# Patient Record
Sex: Female | Born: 1970 | Race: White | Hispanic: No | Marital: Single | Smoking: Former smoker
Health system: Southern US, Academic
[De-identification: ages and names within clinical notes are randomized; demographics above are authoritative.]

## PROBLEM LIST (undated history)

## (undated) DIAGNOSIS — F329 Major depressive disorder, single episode, unspecified: Secondary | ICD-10-CM

## (undated) DIAGNOSIS — F32A Depression, unspecified: Secondary | ICD-10-CM

## (undated) DIAGNOSIS — E0590 Thyrotoxicosis, unspecified without thyrotoxic crisis or storm: Secondary | ICD-10-CM

## (undated) DIAGNOSIS — I10 Essential (primary) hypertension: Secondary | ICD-10-CM

## (undated) HISTORY — PX: HX WISDOM TEETH EXTRACTION: SHX21

## (undated) HISTORY — DX: Depression, unspecified: F32.A

## (undated) HISTORY — DX: Thyrotoxicosis, unspecified without thyrotoxic crisis or storm: E05.90

## (undated) HISTORY — PX: HX TUBAL LIGATION: SHX77

## (undated) HISTORY — PX: HX FOOT SURGERY: 2100001154

## (undated) HISTORY — DX: Essential (primary) hypertension: I10

---
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
## 2018-06-19 ENCOUNTER — Ambulatory Visit (INDEPENDENT_AMBULATORY_CARE_PROVIDER_SITE_OTHER): Payer: Managed Care, Other (non HMO)

## 2018-06-19 ENCOUNTER — Other Ambulatory Visit: Payer: Self-pay

## 2018-06-19 ENCOUNTER — Encounter (INDEPENDENT_AMBULATORY_CARE_PROVIDER_SITE_OTHER): Payer: Self-pay

## 2018-06-19 VITALS — BP 132/82 | HR 115 | Resp 18 | Ht 70.0 in | Wt 139.6 lb

## 2018-06-19 DIAGNOSIS — Z87891 Personal history of nicotine dependence: Secondary | ICD-10-CM

## 2018-06-19 DIAGNOSIS — E0590 Thyrotoxicosis, unspecified without thyrotoxic crisis or storm: Secondary | ICD-10-CM

## 2018-06-19 DIAGNOSIS — E559 Vitamin D deficiency, unspecified: Secondary | ICD-10-CM

## 2018-06-19 DIAGNOSIS — Z6820 Body mass index (BMI) 20.0-20.9, adult: Secondary | ICD-10-CM

## 2018-06-19 DIAGNOSIS — E538 Deficiency of other specified B group vitamins: Secondary | ICD-10-CM

## 2018-06-19 DIAGNOSIS — M6281 Muscle weakness (generalized): Secondary | ICD-10-CM

## 2018-06-19 LAB — CREATINE KINASE (CK), TOTAL, SERUM: CREATINE KINASE (CK): 122 U/L (ref 26–192)

## 2018-06-19 MED ORDER — METHIMAZOLE 10 MG TABLET
10.0000 mg | ORAL_TABLET | Freq: Every day | ORAL | 0 refills | Status: DC
Start: 2018-06-19 — End: 2018-06-26

## 2018-06-19 NOTE — Progress Notes (Addendum)
Endocrinology, Medical Staff Office Bldg  [address] [place] [address]  [phone]    Date: 06/19/2018  Patient Name:  Deborah Parrish   Age: 48 y.o.  Attending: [name], MD  PCP: No Pcp    Assessment/Recommendations:   1. Thyrotoxicosis.  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    Check thyroid stimulating immunoglobulin level today; if positive can treat presumptively for Graves' and if negative will need thyroid scan uptake.  Start methimazole 15 mg a day, she was counseled and understands to hold medication and call of jaundice, fever, chills or sore throat.  Start atenolol 12.5 mg a day and call me with vital signs in the next few days.  I think her edema and air hunger are both due to thyrotoxicosis but further evaluations will need to be considered if not improving; patient understands to call if any worsening.  She was counseled to avoid caffeine, NSAIDs and call/cold medications.  I also asked her to back off on alcohol consumption.  She is planning on traveling to the [place] in coming weeks.  I asked her to reconsider that, especially if not feeling better.  She was encouraged to check Internet regarding availability of healthcare facilities at her destination, [place].    2. Muscle weakness.  Plan:  Check CPK.  Suspect due to hyperthyroidism but I asked her to call back if not improving to make sure not other factors at play.  She does also have low vitamin-D and  more than optimal alcohol intake.      3. Low vitamin-D.  Plan:  Presumptive autoimmune thyroid disease and some gastrointestinal symptoms in addition to low vitamin-D.  So, I will check celiac serology, which is commonly positive in people with thyroid disease.    4. Low B12.  Plan:  Again, concern for autoimmune thyroid disease.  Check intrinsic factor antibodies.      No follow-ups on file.      Chief Complaint:    Chief Complaint   Patient presents with   . New Patient     hyperthyroidism       . Other     pt complains of HR always increased x 5 months       History of Present Illness:    This 48 year old female was referred by Prescott Parma for evaluation of thyrotoxicosis.  Beginning in  October, at about the same time she quit smoking, patient began having issues with increasing heat intolerance, heart racing, tremors, moodiness and insomnia.  She was found to have evidence of thyrotoxicosis on labs as below.  There is a family history of what sounds like Graves' disease in a sister.      Patient denies taking any thyroid related medicines or supplements; no biotin; no contrast, kelp or other iodine exposure; no neck pain.  She is not having any issues with high grittiness or discomfort.  Her weight is up a few lb since she stop smoking.      She is having trouble with leg weakness with walking and has difficulty standing in the mornings.  Does drink up to 6 or 8 alcohol servings nightly.  Took atenolol 25 mg a day but pulse fell into the 50s and 60s, decreased to 12.5 mg a day and then stop.      She has been having some extremity edema and mild air hunger but has no history of cardiovascular disease or heart failure  Does not recall  any issues with low pulse, shortness of breath or other issues on atenolol; she was feeling poorly when she was on atenolol but still feels fatigued.  Bowels are loose at times.    Lab review of results from 06/03/2018 shows undetectable TSH, free T4 of 2.75 (0.82-1.77), free T3 of 11.4 (2.0-4.4).  CMP was unremarkable with normal transaminases and alkaline phosphatase level.  CBC with a white count of 5.0.  ESR was only 3.  Additionally, B12 was 183 and vitamin-D 19.    Allergies:  No Known Allergies    Medications:    No outpatient medications have been marked as taking for the 06/19/18 encounter (Office Visit) with [name], MD.       Past Medical History:  Past Medical History:   Diagnosis Date   . Depression    . Hypertension    . Hyperthyroidism             Social History:    Social History     Socioeconomic History   . Marital status: Single     Spouse name: Not on file   . Number of children: Not on file   . Years of education: Not on file   . Highest education level: Not on file   Tobacco Use   . Smoking status: Former Smoker     Packs/day: 1.00     Years: 20.00     Pack years: 20.00     Types: Cigarettes     Last attempt to quit: 01/14/2018     Years since quitting: 0.4   . Smokeless tobacco: Never Used   Substance and Sexual Activity   . Alcohol use: Yes     Frequency: 4 or more times a week     Drinks per session: 3 or 4   . Drug use: Not Currently   . Sexual activity: Yes     Partners: Male     Birth control/protection: Other       Family History:  Family Medical History:     Problem Relation (Age of Onset)    Alzheimer's/Dementia Maternal Grandmother    Arthritis-osteo Maternal Grandmother    Arthritis-rheumatoid Maternal Grandmother    Asthma Sister    Cancer Sister, Maternal Grandmother    Heart Attack Mother    Stroke Father    Thyroid Disease Sister              Review of Systems:  Constitutional: negative for fevers, chills, weight changes  HEENT: negative for acute vision changes or URI symptoms  Neck:  negative dysphonia, dysphagia or neck pain  Respiratory: negative for cough, sputum, dyspnea on exertion  Cardiovascular: negative for chest discomfort, palpitations  Gastrointestinal: negative for nausea, pain, stool changes  Genitourinary: negative for frequency, dysuria, nocturia, hesitancy  Hematologic/lymphatic: negative for changes in bleeding or bruisability  Musculoskeletal:negative for new myalgisa, arthralgia, and muscle weakness  Neurological: negative for new headaches, vision changes, numbness, weakness, tingling  Psychiatric: mood stable, cognition stable  Endocrine:  As per HPI.    Objective:    Vital Signs:  BP 132/82   Pulse (!) 115   Resp 18   Ht 1.778 m ('5\' 10"' )   Wt 63.3 kg (139 lb 9.6 oz)   LMP  (LMP Unknown) Comment: 4  years no Mens cycle  SpO2 96% Comment: on room air at rest  Breastfeeding No   BMI 20.03 kg/m      Body mass index is 20.03 kg/m.  Examination:  General:  Age-appropriate, no acute distress  HEENT:  Eyes clear.  Extraocular movements intact. Moist mucous membranes.  Neck:  Supple, thyroid is palpable at not overtly enlarged, no bruits.  Thyroid is nontender.  No lymphadenopathy.  Cardiac:  Regular rate and rhythm, no gallops, murmurs or rubs.  No edema.  Pulmonary:  Mild basilar rales clear with coughing.  Pulmonary effort normal at rest but mild dyspnea with activity/walking.  Gastrointestinal:  Nontender, nondistended, bowel sounds positive.    Musculoskeletal:  No deformity or lesions.  Her quadriceps and deltoids feel slightly weak.  Skin:  Slightly sweaty and warm on palms.  Neurologic:  Alert and oriented.  Nonfocal.  Mild tremor.  Psychiatric:  Affect normal.        Diagnostic Review:    As above.    No results found for: HA1C  No results found for: TSH, T3UP, MTUP1, FREET4, T3, THYSTIMUNQNT, THYSTIMMUNOG  No results found for: TSH, TSHCASCADE, T4, T4FREE, T3, FREET3      [name], MD

## 2018-06-19 NOTE — Patient Instructions (Addendum)
1.  Start 1/2 tab of atenolol daily.  Let Dr. Dicky Doe know if problems such as low pulse, lightheadedness, breathing issues.    2.  Contact Dr. Dicky Doe with pulse and blood pressure in next few days.    3.  Take methimazole 15 mg = 1.5 tabs daily with food.  Hold and call if fever, chills or sore throat.    4.  [employer] patient brochures on-line: hyperthyroidism (Graves').

## 2018-06-26 ENCOUNTER — Telehealth (INDEPENDENT_AMBULATORY_CARE_PROVIDER_SITE_OTHER): Payer: Self-pay

## 2018-06-26 DIAGNOSIS — E0590 Thyrotoxicosis, unspecified without thyrotoxic crisis or storm: Secondary | ICD-10-CM

## 2018-06-26 LAB — THYROID STIMULATING IMMUNOGLOBULIN (TSI), SERUM: THYROID-STIMULATING IMMUNOGLOBULIN (TSI), SERUM: 3 TSI index — ABNORMAL HIGH (ref <=1.3)

## 2018-06-26 MED ORDER — METHIMAZOLE 10 MG TABLET
15.0000 mg | ORAL_TABLET | Freq: Every day | ORAL | 1 refills | Status: DC
Start: 2018-06-26 — End: 2018-07-28

## 2018-06-26 NOTE — Telephone Encounter (Signed)
Patient notified regarding celiac serology and TSI results.    Referring to gastroenterology.

## 2018-07-03 ENCOUNTER — Telehealth (INDEPENDENT_AMBULATORY_CARE_PROVIDER_SITE_OTHER): Payer: Self-pay

## 2018-07-03 DIAGNOSIS — E0590 Thyrotoxicosis, unspecified without thyrotoxic crisis or storm: Secondary | ICD-10-CM

## 2018-07-03 NOTE — Telephone Encounter (Signed)
Patient can not get uptake and scan until June 9th at [facility]- would you like referrals to try and get sooner?

## 2018-07-04 NOTE — Telephone Encounter (Signed)
Didn't get your response dr Dicky Doe

## 2018-07-04 NOTE — Telephone Encounter (Signed)
I called the patient, I think her primary care provider might have ordered the scan.  In any event, her thyroid stimulating immunoglobulin was elevated and she has been feeling better on methimazole so I told her I would recommend holding off on the scan and uptake at this point.    I ordered some labs for her to have done at the end of the month, please send a copy to patient and fax copy of order to her PCP.    Additionally, patient has been very anxious because her mother had a heart attack in the last few days.  She has been having some heart racing and chest discomfort with this but has no current symptoms.  Patient counseled she understands to call her PCP about this and to go to the emergency room if recurrence.

## 2018-07-11 ENCOUNTER — Other Ambulatory Visit (INDEPENDENT_AMBULATORY_CARE_PROVIDER_SITE_OTHER): Payer: Self-pay

## 2018-07-17 ENCOUNTER — Telehealth (INDEPENDENT_AMBULATORY_CARE_PROVIDER_SITE_OTHER): Payer: Self-pay

## 2018-07-17 ENCOUNTER — Ambulatory Visit (INDEPENDENT_AMBULATORY_CARE_PROVIDER_SITE_OTHER): Payer: Managed Care, Other (non HMO)

## 2018-07-17 ENCOUNTER — Other Ambulatory Visit: Payer: Self-pay

## 2018-07-17 VITALS — BP 144/88 | HR 73 | Ht 70.0 in | Wt 140.0 lb

## 2018-07-17 DIAGNOSIS — E0590 Thyrotoxicosis, unspecified without thyrotoxic crisis or storm: Secondary | ICD-10-CM

## 2018-07-17 NOTE — Progress Notes (Signed)
Endocrinology, Medical Staff Office Bldg  [address] [place] [address]  [phone]    Date: 07/17/2018  Patient Name:  Deborah Parrish   Age: 48 y.o.  Attending: [name], MD  PCP: No Pcp    Assessment/Recommendations:   1. Thyrotoxicosis.  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  Plan:  Update labs and adjust methimazole as needed, she is presently on 15 mg of methimazole day.  Can hold off on thyroid scan and uptake given strongly positive TSI.Marland Kitchen  She understands to hold methimazole and call right away if fevers, chills, sore throat, jaundice or other new symptoms.  She understands to call if symptoms of thyroid dysfunction which were reviewed      2. Muscle weakness.  CPK normal 06/2018.  Plan:  This is been improving with treatment of her thyroid.    3. Low vitamin-D.  4. Positive celiac serology.    Plan:  She has been refer to gastroenterology.    4. Low B12.  Intrinsic factor antibodies negative 06/2018.  Plan:  Receiving B12 shots with PCP.      No follow-ups on file.      Chief Complaint:    No chief complaint on file.      History of Present Illness:  Interim history 07/17/2018:  This is a follow-up visit by tele health, done with patient consent.  Total phone time about 20 minutes.    Since starting methimazole, patient has been feeling better with decreased tremor and improving muscle weakness in her thighs.  She is having no issues with fevers, chills or sore throat.  Tremors persist but are much milder.  No complaints of heart racing or palpitations.  She was found to have positive celiac serology and has been refer to Gastroenterology; however, she has not heard anything about appointment.  She has no issues with her eyes.  She continues on vitamin-D and vitamin B12.    ------------------------------------------------------------------------------------------------------------------------  This 48 year old female was referred by Prescott Parma for evaluation of thyrotoxicosis.  Beginning in  October, at about the same time she quit smoking, patient began having issues with increasing heat intolerance, heart racing, tremors, moodiness and insomnia.  She was found to have evidence of thyrotoxicosis on labs as below.  There is a family history of what sounds like Graves' disease in a sister.      Patient denies taking any thyroid related medicines or supplements; no biotin; no contrast, kelp or other iodine exposure; no neck pain.  She is not having any issues with high grittiness or discomfort.  Her weight is up a few lb since she stop smoking.      She is having trouble with leg weakness with walking and has difficulty standing in the mornings.  Does drink up to 6 or 8 alcohol servings nightly.  Took atenolol 25 mg a day but pulse fell into the 50s and 60s, decreased to 12.5 mg a day and then stop.      She has been having some extremity edema and mild air hunger but has no history of cardiovascular disease or heart failure  Does not recall any issues with low pulse, shortness of breath or other issues on atenolol; she was feeling poorly when she was on atenolol but still feels fatigued.  Bowels are loose at times.    Lab review of results from 06/03/2018 shows undetectable TSH, free T4 of 2.75 (0.82-1.77), free T3 of 11.4 (  2.0-4.4).  CMP was unremarkable with normal transaminases and alkaline phosphatase level.  CBC with a white count of 5.0.  ESR was only 3.  Additionally, B12 was 183 and vitamin-D 19.    Allergies:  No Known Allergies    Medications:    Outpatient Medications Marked as Taking for the 07/17/18 encounter (Office Visit) with [name], MD   Medication Sig   . acyclovir (ZOVIRAX) 400 mg Oral Tablet TAKE 1 TABLET BY MOUTH EVERY 8 HOURS FOR 10 DAYS   . atenoloL (TENORMIN) 25 mg Oral Tablet    . cyanocobalamin (VITAMIN B12) 1,000 mcg/mL Injection Solution    . ergocalciferol, vitamin D2, (DRISDOL) 1,250 mcg (50,000 unit) Oral Capsule  TAKE 1 CAPSULE BY MOUTH EVERY WEEK   . Ibuprofen (MOTRIN) 800 mg Oral Tablet    . methIMAzole (TAPAZOLE) 10 mg Oral Tablet Take 1.5 Tabs (15 mg total) by mouth Once a day Hold medicine and call if fever, chills or sore throat.       Past Medical History:  Past Medical History:   Diagnosis Date   . Depression    . Hypertension    . Hyperthyroidism            Social History:    Social History     Socioeconomic History   . Marital status: Single     Spouse name: Not on file   . Number of children: Not on file   . Years of education: Not on file   . Highest education level: Not on file   Tobacco Use   . Smoking status: Former Smoker     Packs/day: 1.00     Years: 20.00     Pack years: 20.00     Types: Cigarettes     Last attempt to quit: 01/14/2018     Years since quitting: 0.5   . Smokeless tobacco: Never Used   Substance and Sexual Activity   . Alcohol use: Yes     Frequency: 4 or more times a week     Drinks per session: 3 or 4   . Drug use: Not Currently   . Sexual activity: Yes     Partners: Male     Birth control/protection: Other       Family History:  Family Medical History:     Problem Relation (Age of Onset)    Alzheimer's/Dementia Maternal Grandmother    Arthritis-osteo Maternal Grandmother    Arthritis-rheumatoid Maternal Grandmother    Asthma Sister    Cancer Sister, Maternal Grandmother    Heart Attack Mother    Stroke Father    Thyroid Disease Sister              Review of Systems:  Constitutional: negative for fevers, chills, weight changes  HEENT: negative for acute vision changes or URI symptoms  Neck:  negative dysphonia, dysphagia or neck pain  Respiratory: negative for cough, sputum, dyspnea on exertion  Cardiovascular: negative for chest discomfort, palpitations  Gastrointestinal: negative for nausea, pain, stool changes  Genitourinary: negative for frequency, dysuria, nocturia, hesitancy  Hematologic/lymphatic: negative for changes in bleeding or bruisability  Musculoskeletal:negative for new  myalgisa, arthralgia, and muscle weakness  Neurological: negative for new headaches, vision changes, numbness, weakness, tingling  Psychiatric: mood stable, cognition stable  Endocrine:  As per HPI.    Objective:    Vital Signs:  BP (!) 144/88   Pulse 73   Ht 1.778 m (_0 )   Wt 63.5 kg (140  lb)   LMP  (LMP Unknown) Comment: 4 years no Mens cycle  BMI 20.09 kg/m      Body mass index is 20.09 kg/m.    Examination:  General:  Age-appropriate, no acute distress  HEENT:  Eyes clear.  No over periorbital edema.  Neck:  ---  Cardiac:  ---  Pulmonary:  No coughing.  Pulmonary after normal.  Gastrointestinal:  ---  Musculoskeletal:  ---  Skin:  Appears normal on video conference sing.  Neurologic:  Alert and oriented.  Nonfocal.  Minimal tremor when she holds hands to phone.  Psychiatric:  Affect and mood normal.        Diagnostic Review:    As above.    No results found for: HA1C  No results found for: TSH, T3UP, MTUP1, FREET4, T3, THYSTIMUNQNT, THYSTIMMUNOG  No results found for: TSH, TSHCASCADE, T4, T4FREE, T3, FREET3      [name], MD

## 2018-07-17 NOTE — Telephone Encounter (Signed)
Patient referred to Gastroenterology on or about 06/26/2018.    She has not yet heard anything about appointment.  Please make sure consult has been processed.

## 2018-07-25 ENCOUNTER — Other Ambulatory Visit (INDEPENDENT_AMBULATORY_CARE_PROVIDER_SITE_OTHER): Payer: Self-pay

## 2018-07-28 ENCOUNTER — Telehealth (INDEPENDENT_AMBULATORY_CARE_PROVIDER_SITE_OTHER): Payer: Self-pay

## 2018-07-28 NOTE — Telephone Encounter (Signed)
I refilled her methimazole.  She was supposed to have labs done in [facility] see if back.    Also, she was referred to GI but had not heard of the appointment,  please see if referral was processed.  Marchelle Folks may know as I messaged her previously.

## 2018-08-12 ENCOUNTER — Telehealth (INDEPENDENT_AMBULATORY_CARE_PROVIDER_SITE_OTHER): Payer: Self-pay

## 2018-08-12 DIAGNOSIS — E0590 Thyrotoxicosis, unspecified without thyrotoxic crisis or storm: Secondary | ICD-10-CM

## 2018-08-12 MED ORDER — METHIMAZOLE 5 MG TABLET
5.0000 mg | ORAL_TABLET | Freq: Every day | ORAL | 2 refills | Status: DC
Start: 2018-08-12 — End: 2018-08-19

## 2018-08-12 NOTE — Telephone Encounter (Signed)
Spoke with patient regarding thyroid levels which are slowing.    She reports her BP cuff is telling her her heart is irregular.  Has occasional palpitations but no light headedness, CP or SOB and pulse rte and BP have been OK,    She agrees to check in with PCP ASAP about heart SX and consideration of EKG.    Decreasing MMI from 15 mg daily to 7.5mg  and will repeat levels in 1 week.    Please mail patient copy of lab orders.

## 2018-08-13 ENCOUNTER — Telehealth (INDEPENDENT_AMBULATORY_CARE_PROVIDER_SITE_OTHER): Payer: Self-pay

## 2018-08-13 NOTE — Telephone Encounter (Signed)
Pt states she wanted to let you know EKG results and pt states " it came back normal, they are going to order a Holter monter 24 hr." Thank you.

## 2018-08-19 ENCOUNTER — Telehealth (INDEPENDENT_AMBULATORY_CARE_PROVIDER_SITE_OTHER): Payer: Self-pay

## 2018-08-19 DIAGNOSIS — E0590 Thyrotoxicosis, unspecified without thyrotoxic crisis or storm: Secondary | ICD-10-CM

## 2018-08-19 MED ORDER — METHIMAZOLE 5 MG TABLET
7.5000 mg | ORAL_TABLET | Freq: Every day | ORAL | 2 refills | Status: DC
Start: 2018-08-19 — End: 2018-08-21

## 2018-08-19 NOTE — Telephone Encounter (Signed)
Spoke with patient,  feeling well, palpitations decreased.    She has backed off on MMI to 7.5mg  daily and understands to call if new SX of thyroid hypo- or hyperfunction, which were reviewed.    Please mail orders for labs before next visit at the end of month.

## 2018-08-20 ENCOUNTER — Other Ambulatory Visit (INDEPENDENT_AMBULATORY_CARE_PROVIDER_SITE_OTHER): Payer: Self-pay

## 2018-08-21 MED ORDER — METHIMAZOLE 5 MG TABLET
7.5000 mg | ORAL_TABLET | Freq: Every day | ORAL | 2 refills | Status: DC
Start: 2018-08-21 — End: 2018-09-02

## 2018-08-21 NOTE — Telephone Encounter (Signed)
I changed dose and send prescription yesterday.  Will order again and recent today.  Current dose should be 7.5 mg a day of methimazole.  Please let pharmacy know.

## 2018-09-02 ENCOUNTER — Other Ambulatory Visit (INDEPENDENT_AMBULATORY_CARE_PROVIDER_SITE_OTHER): Payer: Self-pay

## 2018-09-02 MED ORDER — METHIMAZOLE 5 MG TABLET
7.5000 mg | ORAL_TABLET | Freq: Every day | ORAL | 3 refills | Status: DC
Start: 2018-09-02 — End: 2018-09-22

## 2018-09-11 ENCOUNTER — Encounter (INDEPENDENT_AMBULATORY_CARE_PROVIDER_SITE_OTHER): Payer: Self-pay

## 2018-09-22 ENCOUNTER — Ambulatory Visit: Payer: Managed Care, Other (non HMO)

## 2018-09-22 ENCOUNTER — Other Ambulatory Visit: Payer: Self-pay

## 2018-09-22 DIAGNOSIS — M6281 Muscle weakness (generalized): Secondary | ICD-10-CM

## 2018-09-22 DIAGNOSIS — E538 Deficiency of other specified B group vitamins: Secondary | ICD-10-CM

## 2018-09-22 DIAGNOSIS — E0590 Thyrotoxicosis, unspecified without thyrotoxic crisis or storm: Secondary | ICD-10-CM

## 2018-09-22 DIAGNOSIS — E559 Vitamin D deficiency, unspecified: Secondary | ICD-10-CM

## 2018-09-22 MED ORDER — METHIMAZOLE 10 MG TABLET
10.0000 mg | ORAL_TABLET | Freq: Every day | ORAL | 3 refills | Status: DC
Start: 2018-09-22 — End: 2018-12-19

## 2018-09-22 NOTE — Progress Notes (Signed)
Endocrinology, Medical Staff Office Bldg  [address] [place] [address]  [phone]    Date: 09/22/2018  Patient Name:  Deborah Parrish   Age: 48 y.o.  Attending: [name], MD  PCP: No Pcp    Assessment/Recommendations:   1. Thyrotoxicosis.  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  Plan:  Increase methimazole from 7.5 mg to 10 mg a day.  She understands to call if fevers, chills or sore throat or symptoms of thyroid dysfunction, which were reviewed.  Update labs in 4 weeks.  I asked her to curtail alcohol, Tylenol and NSAIDs regarding mildly elevated transaminase levels.      2. Muscle weakness.  CPK normal 06/2018.  Plan:  Improved with treatment of thyroid.    3. Low vitamin-D.  4. Positive celiac serology.    Plan:  Has not heard from Gastroenterology, will refer again.    4. Low B12.  Intrinsic factor antibodies negative 06/2018.  Plan:  Following up with PCP.      No follow-ups on file.      Chief Complaint:    No chief complaint on file.      History of Present Illness:  Interim history 09/22/2018:  Seen by video scteaming.  No cough, shortness of breath or fever.  She is having some sweatiness at night but is otherwise euthyroid.  No further palpitations.  TSH is now slightly suppressed with normal free T4.  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  She is not having any fevers, chills or sore throat.  Still having loose stools and has not heard from Gastroenterology.  Her transaminase levels were minimally elevated.  No Tylenol or NSAIDs but does drink alcohol occasionally.    Interim history 07/17/2018:  This is a follow-up visit by tele health, done with patient consent.  Total phone time about 20 minutes.    Since starting methimazole, patient has been feeling better with decreased tremor and improving muscle weakness in her thighs.  She is having no issues with fevers, chills  or sore throat.  Tremors persist but are much milder.  No complaints of heart racing or palpitations.  She was found to have positive celiac serology and has been refer to Gastroenterology; however, she has not heard anything about appointment.  She has no issues with her eyes.  She continues on vitamin-D and vitamin B12.    ------------------------------------------------------------------------------------------------------------------------  This 48 year old female was referred by Prescott Parma for evaluation of thyrotoxicosis.  Beginning in  October, at about the same time she quit smoking, patient began having issues with increasing heat intolerance, heart racing, tremors, moodiness and insomnia.  She was found to have evidence of thyrotoxicosis on labs as below.  There is a family history of what sounds like Graves' disease in a sister.      Patient denies taking any thyroid related medicines or supplements; no biotin; no contrast, kelp or other iodine exposure; no neck pain.  She is not having any issues with high grittiness or discomfort.  Her weight is up a few lb since she stop smoking.      She is having trouble with leg weakness with walking and has difficulty standing in the mornings.  Does drink up to 6 or 8 alcohol servings nightly.  Took atenolol 25 mg a day but pulse fell into the 50s and 60s, decreased to 12.5 mg  a day and then stop.      She has been having some extremity edema and mild air hunger but has no history of cardiovascular disease or heart failure  Does not recall any issues with low pulse, shortness of breath or other issues on atenolol; she was feeling poorly when she was on atenolol but still feels fatigued.  Bowels are loose at times.    Lab review of results from 06/03/2018 shows undetectable TSH, free T4 of 2.75 (0.82-1.77), free T3 of 11.4 (2.0-4.4).  CMP was unremarkable with normal transaminases and alkaline phosphatase level.  CBC with a white count of 5.0.  ESR was only 3.   Additionally, B12 was 183 and vitamin-D 19.    Allergies:  No Known Allergies    Medications:    No outpatient medications have been marked as taking for the 09/22/18 encounter (Appointment) with [name], MD.       Past Medical History:  Past Medical History:   Diagnosis Date   . Depression    . Hypertension    . Hyperthyroidism            Social History:    Social History     Socioeconomic History   . Marital status: Single     Spouse name: Not on file   . Number of children: Not on file   . Years of education: Not on file   . Highest education level: Not on file   Tobacco Use   . Smoking status: Former Smoker     Packs/day: 1.00     Years: 20.00     Pack years: 20.00     Types: Cigarettes     Last attempt to quit: 01/14/2018     Years since quitting: 0.6   . Smokeless tobacco: Never Used   Substance and Sexual Activity   . Alcohol use: Yes     Frequency: 4 or more times a week     Drinks per session: 3 or 4   . Drug use: Not Currently   . Sexual activity: Yes     Partners: Male     Birth control/protection: Other       Family History:  Family Medical History:     Problem Relation (Age of Onset)    Alzheimer's/Dementia Maternal Grandmother    Arthritis-osteo Maternal Grandmother    Arthritis-rheumatoid Maternal Grandmother    Asthma Sister    Cancer Sister, Maternal Grandmother    Heart Attack Mother    Stroke Father    Thyroid Disease Sister              Review of Systems:  Constitutional: negative for fevers, chills, weight changes  HEENT: negative for acute vision changes or URI symptoms  Neck:  negative dysphonia, dysphagia or neck pain  Respiratory: negative for cough, sputum, dyspnea on exertion  Cardiovascular: negative for chest discomfort, palpitations  Gastrointestinal: negative for nausea, pain, stool changes  Genitourinary: negative for frequency, dysuria, nocturia, hesitancy  Hematologic/lymphatic: negative for changes in bleeding or bruisability  Musculoskeletal:negative for new myalgisa,  arthralgia, and muscle weakness  Neurological: negative for new headaches, vision changes, numbness, weakness, tingling  Psychiatric: mood stable, cognition stable  Endocrine:  As per HPI.    Objective:    Vital Signs:  LMP  (LMP Unknown) Comment: 4 years no Mens cycle     There is no height or weight on file to calculate BMI.    Examination:  General:  Age-appropriate, no acute  distress  HEENT:  Eyes clear.  No over periorbital edema.  Neck:  ---  Cardiac:  ---  Pulmonary:  No coughing.  Pulmonary after normal.  Gastrointestinal:  ---  Musculoskeletal:  ---  Skin:  Appears normal on video conference sing.  Neurologic:  Alert and oriented.  Nonfocal.  Still has minimal tremor when she holds hands to phone.  Psychiatric:  Affect and mood normal.        Diagnostic Review:    As above.    No results found for: HA1C  No results found for: TSH, T3UP, MTUP1, FREET4, T3, THYSTIMUNQNT, THYSTIMMUNOG  No results found for: TSH, TSHCASCADE, T4, T4FREE, T3, FREET3      [name], MD

## 2018-11-19 ENCOUNTER — Other Ambulatory Visit (INDEPENDENT_AMBULATORY_CARE_PROVIDER_SITE_OTHER): Payer: Self-pay

## 2018-11-19 MED ORDER — ATENOLOL 25 MG TABLET
25.0000 mg | ORAL_TABLET | Freq: Every day | ORAL | 3 refills | Status: DC
Start: 2018-11-19 — End: 2019-07-02

## 2018-11-24 ENCOUNTER — Ambulatory Visit: Payer: Managed Care, Other (non HMO)

## 2018-11-24 ENCOUNTER — Other Ambulatory Visit: Payer: Self-pay

## 2018-11-24 DIAGNOSIS — E0590 Thyrotoxicosis, unspecified without thyrotoxic crisis or storm: Secondary | ICD-10-CM

## 2018-11-24 NOTE — Progress Notes (Signed)
Endocrinology, Medical Staff Office [place]  [address] [place] [address]  [phone]    Date: 11/24/2018  Patient Name:  Deborah Parrish   Age: 48 y.o.  Attending: [name], MD  PCP: No Pcp      This was a phone visit.  Patient consented to process and billing.  About 15 minutes on phone.        Assessment/Recommendations:   1. Thyrotoxicosis.  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  Plan:  Decrease methimazole 10 mg-->5 a day while awaiting labs I will order today.    She understands to call if fevers, chills or sore throat or symptoms of thyroid dysfunction, which were reviewed.    Discussed longer term options.  She will consider further and was referred to [employer] patient brochures.    Avoid alcohol, Tylenol and NSAIDs regarding mildly elevated transaminase levels.    2. Low vitamin-D.  3. Positive celiac serology.    Plan:  F/U with GI.    4. Low B12.  Intrinsic factor antibodies negative 06/2018.  Plan:  Following up with PCP.      No follow-ups on file.      Chief Complaint:    No chief complaint on file.      History of Present Illness:  Interim history 11/24/2018:    I have not received labs but patient reports TSH was 6.4 in early July.  She has been on MMI 10 mg daily since last visit.  Some fatigue and emotional at times (no SI).    Interim history 09/22/2018:  Seen by video steaming.  No cough, shortness of breath or fever.  She is having some sweatiness at night but is otherwise euthyroid.  No further palpitations.  TSH is now slightly suppressed with normal free T4.  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  She is not having any fevers, chills or sore throat.  Still having loose stools and has not heard from Gastroenterology.  Her transaminase levels were minimally elevated.  No Tylenol or NSAIDs but does drink alcohol occasionally.    Interim history  07/17/2018:  This is a follow-up visit by tele health, done with patient consent.  Total phone time about 20 minutes.    Since starting methimazole, patient has been feeling better with decreased tremor and improving muscle weakness in her thighs.  She is having no issues with fevers, chills or sore throat.  Tremors persist but are much milder.  No complaints of heart racing or palpitations.  She was found to have positive celiac serology and has been refer to Gastroenterology; however, she has not heard anything about appointment.  She has no issues with her eyes.  She continues on vitamin-D and vitamin B12.    ------------------------------------------------------------------------------------------------------------------------  This 48 year old female was referred by Prescott Parma for evaluation of thyrotoxicosis.  Beginning in  October, at about the same time she quit smoking, patient began having issues with increasing heat intolerance, heart racing, tremors, moodiness and insomnia.  She was found to have evidence of thyrotoxicosis on labs as below.  There is a family history of what sounds like Graves' disease in a sister.      Patient denies taking any thyroid related medicines or supplements; no biotin; no contrast, kelp or other iodine exposure; no neck pain.  She is not having any issues with high grittiness or  discomfort.  Her weight is up a few lb since she stop smoking.      She is having trouble with leg weakness with walking and has difficulty standing in the mornings.  Does drink up to 6 or 8 alcohol servings nightly.  Took atenolol 25 mg a day but pulse fell into the 50s and 60s, decreased to 12.5 mg a day and then stop.      She has been having some extremity edema and mild air hunger but has no history of cardiovascular disease or heart failure  Does not recall any issues with low pulse, shortness of breath or other issues on atenolol; she was feeling poorly when she was on atenolol but still  feels fatigued.  Bowels are loose at times.    Lab review of results from 06/03/2018 shows undetectable TSH, free T4 of 2.75 (0.82-1.77), free T3 of 11.4 (2.0-4.4).  CMP was unremarkable with normal transaminases and alkaline phosphatase level.  CBC with a white count of 5.0.  ESR was only 3.  Additionally, B12 was 183 and vitamin-D 19.    Allergies:  No Known Allergies    Medications:    No outpatient medications have been marked as taking for the 11/24/18 encounter (Appointment) with [name], MD.       Past Medical History:  Past Medical History:   Diagnosis Date   . Depression    . Hypertension    . Hyperthyroidism            Social History:    Social History     Socioeconomic History   . Marital status: Single     Spouse name: Not on file   . Number of children: Not on file   . Years of education: Not on file   . Highest education level: Not on file   Tobacco Use   . Smoking status: Former Smoker     Packs/day: 1.00     Years: 20.00     Pack years: 20.00     Types: Cigarettes     Last attempt to quit: 01/14/2018     Years since quitting: 0.8   . Smokeless tobacco: Never Used   Substance and Sexual Activity   . Alcohol use: Yes     Frequency: 4 or more times a week     Drinks per session: 3 or 4   . Drug use: Not Currently   . Sexual activity: Yes     Partners: Male     Birth control/protection: Other       Family History:  Family Medical History:     Problem Relation (Age of Onset)    Alzheimer's/Dementia Maternal Grandmother    Arthritis-osteo Maternal Grandmother    Arthritis-rheumatoid Maternal Grandmother    Asthma Sister    Cancer Sister, Maternal Grandmother    Heart Attack Mother    Stroke Father    Thyroid Disease Sister              Review of Systems:  Constitutional: negative for fevers, chills, weight changes  HEENT: negative for acute vision changes or URI symptoms  Neck:  negative dysphonia, dysphagia or neck pain  Respiratory: negative for cough, sputum, dyspnea on exertion  Cardiovascular:  negative for chest discomfort, palpitations  Gastrointestinal: negative for nausea, pain, stool changes  Genitourinary: negative for frequency, dysuria, nocturia, hesitancy  Hematologic/lymphatic: negative for changes in bleeding or bruisability  Musculoskeletal:negative for new myalgisa, arthralgia, and muscle weakness  Neurological: negative for  new headaches, vision changes, numbness, weakness, tingling  Psychiatric: mood stable, cognition stable  Endocrine:  As per HPI.    Objective:    Vital Signs:  Last Menstrual Period  (LMP Unknown) Comment: 4 years no Mens cycle     There is no height or weight on file to calculate BMI.    Examination:  Phone visit.        Diagnostic Review:    As above.    No results found for: HA1C  No results found for: TSH, T3UP, MTUP1, FREET4, T3, THYSTIMUNQNT, THYSTIMMUNOG  No results found for: TSH, TSHCASCADE, T4, T4FREE, T3, FREET3      [name], MD

## 2018-11-26 ENCOUNTER — Telehealth (INDEPENDENT_AMBULATORY_CARE_PROVIDER_SITE_OTHER): Payer: Self-pay

## 2018-11-26 NOTE — Telephone Encounter (Signed)
Deborah Parrish Deborah Parrish's has question about her lab work and receiving a call from Dr. Alphonzo Dublin she can be reached at [phone] she was scheduled for this coming Thursday 11/27/2018

## 2018-11-27 ENCOUNTER — Encounter (INDEPENDENT_AMBULATORY_CARE_PROVIDER_SITE_OTHER): Payer: Self-pay

## 2018-12-01 ENCOUNTER — Encounter (INDEPENDENT_AMBULATORY_CARE_PROVIDER_SITE_OTHER): Payer: Self-pay

## 2018-12-01 ENCOUNTER — Ambulatory Visit: Payer: Managed Care, Other (non HMO)

## 2018-12-01 ENCOUNTER — Telehealth (INDEPENDENT_AMBULATORY_CARE_PROVIDER_SITE_OTHER): Payer: Self-pay

## 2018-12-01 ENCOUNTER — Other Ambulatory Visit: Payer: Self-pay

## 2018-12-01 DIAGNOSIS — E0590 Thyrotoxicosis, unspecified without thyrotoxic crisis or storm: Secondary | ICD-10-CM

## 2018-12-01 NOTE — Progress Notes (Addendum)
Endocrinology, Medical Staff Office Bldg  [address] [place] [address]  [phone]    Date: 12/01/2018  Patient Name:  Deborah Parrish   Age: 48 y.o.  Attending: [name], MD  PCP: No Pcp      This was a telemedicine visit done by video streaming  Patient consented to process and billing.  About 15 minutes on phone.        Assessment/Recommendations:   1. Thyrotoxicosis.  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  Plan:  Yet to receive labs, continue methimazole 5 mg a day while trying to obtain labs from [facility] from [facility] in [place], [place].    She understands to call if fevers, chills or sore throat or symptoms of thyroid dysfunction, which were reviewed.    Discussed longer term options further, she is most interested in continuing methimazole for now.        2. Low vitamin-D.  3. Positive celiac serology.    Plan:  F/U with GI.    4. Low B12.  Intrinsic factor antibodies negative 06/2018.  Plan:  Following up with PCP.      Return in about 2 days (around 12/03/2018).      Chief Complaint:    Chief Complaint   Patient presents with   . Graves Disease       History of Present Illness:  Interim history 12/01/2018:  Since last visit, she was able to have labs done with some difficulty for reasons that are unclear.  She ended up having labs done at [facility] but results have not found their way to my office yet.  She is clinically euthyroid without any heart racing or tremors.  She wants to stick with methimazole for now rather than proceeding to surgery or radioactive iodine.  No fevers, chills, sore throat, jaundice or nausea.    Interim history 11/24/2018:    I have not received labs but patient reports TSH was 6.4 in early July.  She has been on MMI 10 mg daily since last visit.  Some fatigue and emotional at times (no SI).    Interim history 09/22/2018:  Seen by video steaming.  No cough, shortness of breath or fever.  She is  having some sweatiness at night but is otherwise euthyroid.  No further palpitations.  TSH is now slightly suppressed with normal free T4.  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  She is not having any fevers, chills or sore throat.  Still having loose stools and has not heard from Gastroenterology.  Her transaminase levels were minimally elevated.  No Tylenol or NSAIDs but does drink alcohol occasionally.    Interim history 07/17/2018:  This is a follow-up visit by tele health, done with patient consent.  Total phone time about 20 minutes.    Since starting methimazole, patient has been feeling better with decreased tremor and improving muscle weakness in her thighs.  She is having no issues with fevers, chills or sore throat.  Tremors persist but are much milder.  No complaints of heart racing or palpitations.  She was found to have positive celiac serology and has been refer to Gastroenterology; however, she has not heard anything about appointment.  She has no issues with her eyes.  She continues on vitamin-D and vitamin B12.    ------------------------------------------------------------------------------------------------------------------------  This 48 year old female was referred by Belenda Cruise  [place] for evaluation of thyrotoxicosis.  Beginning in  October, at about the same time she quit smoking, patient began having issues with increasing heat intolerance, heart racing, tremors, moodiness and insomnia.  She was found to have evidence of thyrotoxicosis on labs as below.  There is a family history of what sounds like Graves' disease in a sister.      Patient denies taking any thyroid related medicines or supplements; no biotin; no contrast, kelp or other iodine exposure; no neck pain.  She is not having any issues with high grittiness or discomfort.  Her weight is up a few lb since she stop smoking.      She is having trouble with leg  weakness with walking and has difficulty standing in the mornings.  Does drink up to 6 or 8 alcohol servings nightly.  Took atenolol 25 mg a day but pulse fell into the 50s and 60s, decreased to 12.5 mg a day and then stop.      She has been having some extremity edema and mild air hunger but has no history of cardiovascular disease or heart failure  Does not recall any issues with low pulse, shortness of breath or other issues on atenolol; she was feeling poorly when she was on atenolol but still feels fatigued.  Bowels are loose at times.    Lab review of results from 06/03/2018 shows undetectable TSH, free T4 of 2.75 (0.82-1.77), free T3 of 11.4 (2.0-4.4).  CMP was unremarkable with normal transaminases and alkaline phosphatase level.  CBC with a white count of 5.0.  ESR was only 3.  Additionally, B12 was 183 and vitamin-D 19.    Allergies:  No Known Allergies    Medications:    Outpatient Medications Marked as Taking for the 12/01/18 encounter (Office Visit) with [name], MD   Medication Sig   . methIMAzole (TAPAZOLE) 10 mg Oral Tablet Take 1 Tab (10 mg total) by mouth Once a day Hold medicine and call fever, chills or sore throat. (Patient taking differently: Take 5 mg by mouth Once a day Hold medicine and call fever, chills or sore throat.)       Past Medical History:  Past Medical History:   Diagnosis Date   . Depression    . Hypertension    . Hyperthyroidism            Social History:    Social History     Socioeconomic History   . Marital status: Single     Spouse name: Not on file   . Number of children: Not on file   . Years of education: Not on file   . Highest education level: Not on file   Tobacco Use   . Smoking status: Former Smoker     Packs/day: 1.00     Years: 20.00     Pack years: 20.00     Types: Cigarettes     Last attempt to quit: 01/14/2018     Years since quitting: 0.8   . Smokeless tobacco: Never Used   Substance and Sexual Activity   . Alcohol use: Yes     Frequency: 4 or more times a  week     Drinks per session: 3 or 4   . Drug use: Not Currently   . Sexual activity: Yes     Partners: Male     Birth control/protection: Other       Family History:  Family Medical History:     Problem  Relation (Age of Onset)    Alzheimer's/Dementia Maternal Grandmother    Arthritis-osteo Maternal Grandmother    Arthritis-rheumatoid Maternal Grandmother    Asthma Sister    Cancer Sister, Maternal Grandmother    Heart Attack Mother    Stroke Father    Thyroid Disease Sister              Review of Systems:  Constitutional: negative for fevers, chills, weight changes  HEENT: negative for acute vision changes or URI symptoms  Neck:  negative dysphonia, dysphagia or neck pain  Respiratory: negative for cough, sputum, dyspnea on exertion  Cardiovascular: negative for chest discomfort, palpitations  Gastrointestinal: negative for nausea, pain, stool changes  Genitourinary: negative for frequency, dysuria, nocturia, hesitancy  Hematologic/lymphatic: negative for changes in bleeding or bruisability  Musculoskeletal:negative for new myalgisa, arthralgia, and muscle weakness  Neurological: negative for new headaches, vision changes, numbness, weakness, tingling  Psychiatric: mood stable, cognition stable  Endocrine:  As per HPI.    Objective:    Vital Signs:  Last Menstrual Period  (LMP Unknown) Comment: 4 years no Mens cycle     There is no height or weight on file to calculate BMI.    Examination:  Phone visit.        Diagnostic Review:    As above.    No results found for: HA1C  No results found for: TSH, T3UP, MTUP1, FREET4, T3, THYSTIMUNQNT, THYSTIMMUNOG  No results found for: TSH, TSHCASCADE, T4, T4FREE, T3, FREET3      [name], MD

## 2018-12-01 NOTE — Telephone Encounter (Signed)
Please get patient's labs from [facility] at [facility] in [place], [place].  She had them done last Friday.

## 2018-12-02 ENCOUNTER — Telehealth (INDEPENDENT_AMBULATORY_CARE_PROVIDER_SITE_OTHER): Payer: Self-pay

## 2018-12-02 DIAGNOSIS — E0500 Thyrotoxicosis with diffuse goiter without thyrotoxic crisis or storm: Secondary | ICD-10-CM

## 2018-12-02 NOTE — Telephone Encounter (Signed)
Received labs from [facility] dated 11/28/2018.    Stick with MMI 5 mg daily, patient informed.    Deborah Parrish:  Please mail her lab orders that I will place presently.    Please cancel her appointment for tomorrow and make her another appointment for video stream in 4 weeks.    Thanks

## 2018-12-03 ENCOUNTER — Encounter (INDEPENDENT_AMBULATORY_CARE_PROVIDER_SITE_OTHER): Payer: Self-pay

## 2018-12-03 ENCOUNTER — Telehealth (INDEPENDENT_AMBULATORY_CARE_PROVIDER_SITE_OTHER): Payer: Self-pay

## 2018-12-03 NOTE — Telephone Encounter (Signed)
Please schedule patient to see me in the week of September 21st by video stream.    Please cancel at her visit from today.    I ordered some labs earlier this week, they should have been mailed to patient.

## 2018-12-19 ENCOUNTER — Other Ambulatory Visit (INDEPENDENT_AMBULATORY_CARE_PROVIDER_SITE_OTHER): Payer: Self-pay

## 2018-12-31 ENCOUNTER — Other Ambulatory Visit: Payer: Self-pay

## 2018-12-31 ENCOUNTER — Ambulatory Visit (INDEPENDENT_AMBULATORY_CARE_PROVIDER_SITE_OTHER): Payer: Managed Care, Other (non HMO)

## 2018-12-31 DIAGNOSIS — E0590 Thyrotoxicosis, unspecified without thyrotoxic crisis or storm: Secondary | ICD-10-CM

## 2018-12-31 DIAGNOSIS — E559 Vitamin D deficiency, unspecified: Secondary | ICD-10-CM

## 2018-12-31 DIAGNOSIS — E538 Deficiency of other specified B group vitamins: Secondary | ICD-10-CM

## 2018-12-31 NOTE — Progress Notes (Signed)
Endocrinology, Medical Staff Office Bldg  [address] [place] [address]  [phone]    Date: 12/31/2018  Patient Name:  Deborah Parrish   Age: 48 y.o.  Attending: [name], MD  PCP: No Pcp      This was a telemedicine visit done by video streaming  Patient consented to process and billing.  About20 minutes on phone.        Assessment/Recommendations:   1. Thyrotoxicosis.  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  Started Colon 06/2018.  Plan:  Levels normal and methimazole needs given gradually been decreasing.  Continue current methimazole dose.  Call if symptoms of thyroid dysfunction, which were reviewed.  She understands to hold medication and call if fevers, chills or sore throat.  Follow-up labs in 1 month and again before next visit in 2 months.      2. Low vitamin-D.  3. Positive (borderline) celiac serology.  Biopsy was negative but had H. pylori.  Plan:  F/U with GI.    4. Low B12.  Intrinsic factor antibodies negative 06/2018.  Plan:  Following up with PCP.      No follow-ups on file.      Chief Complaint:    No chief complaint on file.      History of Present Illness:  Interim history 12/31/2018:  Clinically euthyroid.  We had trouble getting her laboratories initially but were able to receive them from [facility] after calling.  Thyroid levels were normal.  TSH has improved to mid normal with decrement in methimazole over the summer.  No fevers, chills or sore throat.      Interim history 12/01/2018:  Since last visit, she was able to have labs done with some difficulty for reasons that are unclear.  She ended up having labs done at [facility] but results have not found their way to my office yet.  She is clinically euthyroid without any heart racing or tremors.  She wants to stick with methimazole for now rather than proceeding to surgery or radioactive iodine.  No fevers, chills, sore throat, jaundice or nausea.    Interim history 11/24/2018:    I  have not received labs but patient reports TSH was 6.4 in early July.  She has been on MMI 10 mg daily since last visit.  Some fatigue and emotional at times (no SI).    Interim history 09/22/2018:  Seen by video steaming.  No cough, shortness of breath or fever.  She is having some sweatiness at night but is otherwise euthyroid.  No further palpitations.  TSH is now slightly suppressed with normal free T4.  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  She is not having any fevers, chills or sore throat.  Still having loose stools and has not heard from Gastroenterology.  Her transaminase levels were minimally elevated.  No Tylenol or NSAIDs but does drink alcohol occasionally.    Interim history 07/17/2018:  This is a follow-up visit by tele health, done with patient consent.  Total phone time about 20 minutes.    Since starting methimazole, patient has been feeling better with decreased tremor and improving muscle weakness in her thighs.  She is having no issues with fevers, chills or sore throat.  Tremors persist but are much milder.  No complaints of heart racing or palpitations.  She was found to have positive celiac serology  and has been refer to Gastroenterology; however, she has not heard anything about appointment.  She has no issues with her eyes.  She continues on vitamin-D and vitamin B12.    ------------------------------------------------------------------------------------------------------------------------  This 47 year old female was referred by Prescott Parma for evaluation of thyrotoxicosis.  Beginning in  October, at about the same time she quit smoking, patient began having issues with increasing heat intolerance, heart racing, tremors, moodiness and insomnia.  She was found to have evidence of thyrotoxicosis on labs as below.  There is a family history of what sounds like Graves' disease in a sister.      Patient denies taking  any thyroid related medicines or supplements; no biotin; no contrast, kelp or other iodine exposure; no neck pain.  She is not having any issues with high grittiness or discomfort.  Her weight is up a few lb since she stop smoking.      She is having trouble with leg weakness with walking and has difficulty standing in the mornings.  Does drink up to 6 or 8 alcohol servings nightly.  Took atenolol 25 mg a day but pulse fell into the 50s and 60s, decreased to 12.5 mg a day and then stop.      She has been having some extremity edema and mild air hunger but has no history of cardiovascular disease or heart failure  Does not recall any issues with low pulse, shortness of breath or other issues on atenolol; she was feeling poorly when she was on atenolol but still feels fatigued.  Bowels are loose at times.    Lab review of results from 06/03/2018 shows undetectable TSH, free T4 of 2.75 (0.82-1.77), free T3 of 11.4 (2.0-4.4).  CMP was unremarkable with normal transaminases and alkaline phosphatase level.  CBC with a white count of 5.0.  ESR was only 3.  Additionally, B12 was 183 and vitamin-D 19.    Allergies:  No Known Allergies    Medications:    No outpatient medications have been marked as taking for the 12/31/18 encounter (Appointment) with [name], MD.       Past Medical History:  Past Medical History:   Diagnosis Date   . Depression    . Hypertension    . Hyperthyroidism            Social History:    Social History     Socioeconomic History   . Marital status: Single     Spouse name: Not on file   . Number of children: Not on file   . Years of education: Not on file   . Highest education level: Not on file   Tobacco Use   . Smoking status: Former Smoker     Packs/day: 1.00     Years: 20.00     Pack years: 20.00     Types: Cigarettes     Last attempt to quit: 01/14/2018     Years since quitting: 0.9   . Smokeless tobacco: Never Used   Substance and Sexual Activity   . Alcohol use: Yes     Frequency: 4 or more  times a week     Drinks per session: 3 or 4   . Drug use: Not Currently   . Sexual activity: Yes     Partners: Male     Birth control/protection: Other       Family History:  Family Medical History:     Problem Relation (Age of Onset)    Alzheimer's/Dementia Maternal  Grandmother    Arthritis-osteo Maternal Grandmother    Arthritis-rheumatoid Maternal Grandmother    Asthma Sister    Cancer Sister, Maternal Grandmother    Heart Attack Mother    Stroke Father    Thyroid Disease Sister              Review of Systems:  Constitutional: negative for fevers, chills, weight changes  HEENT: negative for acute vision changes or URI symptoms  Neck:  negative dysphonia, dysphagia or neck pain  Respiratory: negative for cough, sputum, dyspnea on exertion  Cardiovascular: negative for chest discomfort, palpitations  Gastrointestinal: negative for nausea, pain, stool changes  Genitourinary: negative for frequency, dysuria, nocturia, hesitancy  Hematologic/lymphatic: negative for changes in bleeding or bruisability  Musculoskeletal:negative for new myalgisa, arthralgia, and muscle weakness  Neurological: negative for new headaches, vision changes, numbness, weakness, tingling  Psychiatric: mood stable, cognition stable  Endocrine:  As per HPI.    Objective:    Vital Signs:  LMP  (LMP Unknown) Comment: 4 years no Mens cycle     There is no height or weight on file to calculate BMI.    Examination:  Gen:  Age appropriate, no acute distress.  HEENT:  Eyes clear, extraocular muscles intact.  Pulmonary:  No coughing, respiratory effort normal.  Neurologic:  Alert and oriented, no tremor.  Psychiatric: mood and affect normal.        Diagnostic Review:    As above.    No results found for: HA1C  No results found for: TSH, T3UP, MTUP1, FREET4, T3, THYSTIMUNQNT, THYSTIMMUNOG  No results found for: TSH, TSHCASCADE, T4, T4FREE, T3, FREET3      [name], MD

## 2019-01-01 ENCOUNTER — Telehealth (INDEPENDENT_AMBULATORY_CARE_PROVIDER_SITE_OTHER): Payer: Self-pay

## 2019-01-01 NOTE — Telephone Encounter (Signed)
Please mail her lab orders, she is to have labs done in 1 month and before her next visit in 2 or 3 months.      Please arrange a follow-up visit in 2-3 months in the office.

## 2019-01-01 NOTE — Telephone Encounter (Signed)
Advised pt. Mailed out both sets of lab orders. Next appt 03-16-19

## 2019-01-06 ENCOUNTER — Telehealth (INDEPENDENT_AMBULATORY_CARE_PROVIDER_SITE_OTHER): Payer: Self-pay

## 2019-01-06 NOTE — Telephone Encounter (Signed)
Spoke with patient.  Told I could not formally advise her on herbal medicines, especially with concern for adulteration.  However, we can f/u her levels closely and she understands to call if new symptoms of thyroid dysfunction.

## 2019-01-06 NOTE — Telephone Encounter (Signed)
Patient is wanting to try something more natural since being post menopausal. She has found a cream that is all natural and wants to make sure that it wouldn't interfere with her thyroid medication.

## 2019-01-28 ENCOUNTER — Telehealth (INDEPENDENT_AMBULATORY_CARE_PROVIDER_SITE_OTHER): Payer: Self-pay

## 2019-01-28 DIAGNOSIS — E0500 Thyrotoxicosis with diffuse goiter without thyrotoxic crisis or storm: Secondary | ICD-10-CM

## 2019-01-28 NOTE — Telephone Encounter (Signed)
Thyroid levels good.  Continue current medication.  Can repeat labs here at next visit or before next visit if she prefers in which case please mail her the labs I just ordered.

## 2019-03-10 LAB — THYROID STIMULATING HORMONE (SENSITIVE TSH): TSH: 1.37

## 2019-03-10 LAB — T3 (TRIIODOTHYRONINE), FREE, SERUM: FREE T3: 3.2

## 2019-03-10 LAB — THYROXINE, FREE (FREE T4): THYROXINE, FREE (FREE T4): 1.24

## 2019-03-16 ENCOUNTER — Encounter (INDEPENDENT_AMBULATORY_CARE_PROVIDER_SITE_OTHER): Payer: Self-pay

## 2019-03-16 ENCOUNTER — Other Ambulatory Visit: Payer: Self-pay

## 2019-03-16 ENCOUNTER — Ambulatory Visit (INDEPENDENT_AMBULATORY_CARE_PROVIDER_SITE_OTHER): Payer: Managed Care, Other (non HMO)

## 2019-03-16 VITALS — BP 138/80 | HR 77 | Ht 70.0 in | Wt 159.8 lb

## 2019-03-16 DIAGNOSIS — E0500 Thyrotoxicosis with diffuse goiter without thyrotoxic crisis or storm: Secondary | ICD-10-CM

## 2019-03-16 DIAGNOSIS — Z6822 Body mass index (BMI) 22.0-22.9, adult: Secondary | ICD-10-CM

## 2019-03-16 MED ORDER — METHIMAZOLE 5 MG TABLET
5.0000 mg | ORAL_TABLET | Freq: Every day | ORAL | 4 refills | Status: DC
Start: 2019-03-16 — End: 2019-06-12

## 2019-03-16 NOTE — Progress Notes (Signed)
Endocrinology, Medical Staff Office Bldg  [address] [place] [address]  [phone]    Date: 03/16/2019  Patient Name:  Deborah Parrish   Age: 48 y.o.  Attending: [name], MD  PCP: No Pcp      Assessment/Recommendations:   1.  Graves' Thyrotoxicosis.  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  Started Abbott 06/2018.  Plan:   Continue current methimazole dose.  Call if symptoms of thyroid dysfunction, which were reviewed.  She understands to hold medication and call if fevers, chills or sore throat, jaundice, abdominal pain.  Still prefers MMI to surgery or RAI.    Trend TFTS with TSI in 2 months or sooner if new symptoms.      2. Low vitamin-D.  3. Positive (borderline) celiac serology.  Biopsy was negative but had H. pylori.  Plan:  F/U with GI.    4. Low B12.  Intrinsic factor antibodies negative 06/2018.  Plan:  Following up with PCP.      Return in about 2 months (around 05/16/2019).      Chief Complaint:    Chief Complaint   Patient presents with   . Graves Disease       History of Present Illness:  Interim history 03/16/2019:  Feeling well.  Complains of weight gain--stopped smoking earlier this year.  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  No heart racing or tremor.    Biochemistry normal pre-visit.    Interim history 12/31/2018:  Clinically euthyroid.  We had trouble getting her laboratories initially but were able to receive them from [facility] after calling.  Thyroid levels were normal.  TSH has improved to mid normal with decrement in methimazole over the summer.  No fevers, chills or sore throat.      Interim history 12/01/2018:  Since last visit, she was able to have labs done with some difficulty for reasons that are unclear.  She ended up having labs done at [facility] but results have not found their way to my office yet.  She is clinically euthyroid without any heart racing or tremors.  She wants to stick with methimazole for now rather than  proceeding to surgery or radioactive iodine.  No fevers, chills, sore throat, jaundice or nausea.    Interim history 11/24/2018:    I have not received labs but patient reports TSH was 6.4 in early July.  She has been on MMI 10 mg daily since last visit.  Some fatigue and emotional at times (no SI).    Interim history 09/22/2018:  Seen by video steaming.  No cough, shortness of breath or fever.  She is having some sweatiness at night but is otherwise euthyroid.  No further palpitations.  TSH is now slightly suppressed with normal free T4.  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  She is not having any fevers, chills or sore throat.  Still having loose stools and has not heard from Gastroenterology.  Her transaminase levels were minimally elevated.  No Tylenol or NSAIDs but does drink alcohol occasionally.    Interim history 07/17/2018:  This is a follow-up visit by tele health, done with patient consent.  Total phone time about 20 minutes.    Since starting methimazole, patient has been feeling better with decreased tremor and improving muscle weakness in her thighs.  She is having no issues with fevers, chills or sore  throat.  Tremors persist but are much milder.  No complaints of heart racing or palpitations.  She was found to have positive celiac serology and has been refer to Gastroenterology; however, she has not heard anything about appointment.  She has no issues with her eyes.  She continues on vitamin-D and vitamin B12.    ------------------------------------------------------------------------------------------------------------------------  This 48 year old female was referred by Prescott Parma for evaluation of thyrotoxicosis.  Beginning in  October, at about the same time she quit smoking, patient began having issues with increasing heat intolerance, heart racing, tremors, moodiness and insomnia.  She was found to have evidence of  thyrotoxicosis on labs as below.  There is a family history of what sounds like Graves' disease in a sister.      Patient denies taking any thyroid related medicines or supplements; no biotin; no contrast, kelp or other iodine exposure; no neck pain.  She is not having any issues with high grittiness or discomfort.  Her weight is up a few lb since she stop smoking.      She is having trouble with leg weakness with walking and has difficulty standing in the mornings.  Does drink up to 6 or 8 alcohol servings nightly.  Took atenolol 25 mg a day but pulse fell into the 50s and 60s, decreased to 12.5 mg a day and then stop.      She has been having some extremity edema and mild air hunger but has no history of cardiovascular disease or heart failure  Does not recall any issues with low pulse, shortness of breath or other issues on atenolol; she was feeling poorly when she was on atenolol but still feels fatigued.  Bowels are loose at times.    Lab review of results from 06/03/2018 shows undetectable TSH, free T4 of 2.75 (0.82-1.77), free T3 of 11.4 (2.0-4.4).  CMP was unremarkable with normal transaminases and alkaline phosphatase level.  CBC with a white count of 5.0.  ESR was only 3.  Additionally, B12 was 183 and vitamin-D 19.    Allergies:  No Known Allergies    Medications:    Outpatient Medications Marked as Taking for the 03/16/19 encounter (Office Visit) with [name], MD   Medication Sig   . atenoloL (TENORMIN) 25 mg Oral Tablet Take 1 Tab (25 mg total) by mouth Once a day   . cyanocobalamin (VITAMIN B12) 1,000 mcg/mL Injection Solution    . ergocalciferol, vitamin D2, (DRISDOL) 1,250 mcg (50,000 unit) Oral Capsule TAKE 1 CAPSULE BY MOUTH EVERY WEEK   . methIMAzole (TAPAZOLE) 5 mg Oral Tablet Take 1 Tab (5 mg total) by mouth Once a day Hold medicine and call fever, chills or sore throat, jaundice, abdominal pain.   . pantoprazole (PROTONIX) 40 mg Oral Tablet, Delayed Release (E.C.) TAKE 1 TABLET DAILY             Past Medical History:  Past Medical History:   Diagnosis Date   . Depression    . Hypertension    . Hyperthyroidism            Social History:    Social History     Socioeconomic History   . Marital status: Single     Spouse name: Not on file   . Number of children: Not on file   . Years of education: Not on file   . Highest education level: Not on file   Tobacco Use   . Smoking status: Former Smoker  Packs/day: 1.00     Years: 20.00     Pack years: 20.00     Types: Cigarettes     Quit date: 01/14/2018     Years since quitting: 1.1   . Smokeless tobacco: Never Used   Substance and Sexual Activity   . Alcohol use: Yes     Frequency: 4 or more times a week     Drinks per session: 3 or 4   . Drug use: Not Currently   . Sexual activity: Yes     Partners: Male     Birth control/protection: Other       Family History:  Family Medical History:     Problem Relation (Age of Onset)    Alzheimer's/Dementia Maternal Grandmother    Arthritis-osteo Maternal Grandmother    Arthritis-rheumatoid Maternal Grandmother    Asthma Sister    Cancer Sister, Maternal Grandmother    Heart Attack Mother    Stroke Father    Thyroid Disease Sister              Review of Systems:  Constitutional: negative for fevers, chills, weight changes  HEENT: negative for acute vision changes or URI symptoms  Neck:  negative dysphonia, dysphagia or neck pain  Respiratory: negative for cough, sputum, dyspnea on exertion  Cardiovascular: negative for chest discomfort, palpitations  Gastrointestinal: negative for nausea, pain, stool changes  Genitourinary: negative for frequency, dysuria, nocturia, hesitancy  Hematologic/lymphatic: negative for changes in bleeding or bruisability  Musculoskeletal:negative for new myalgisa, arthralgia, and muscle weakness  Neurological: negative for new headaches, vision changes, numbness, weakness, tingling  Psychiatric: mood stable, cognition stable  Endocrine:  As per HPI.    Objective:    Vital Signs:  BP 138/80    Pulse 77   Ht 1.778 m ('5\' 10"' )   Wt 72.5 kg (159 lb 12.8 oz)   LMP  (LMP Unknown) Comment: 4 years no Mens cycle  SpO2 98%   BMI 22.93 kg/m      Body mass index is 22.93 kg/m.    Examination:  Gen:  Age appropriate, no acute distress.    HEENT:  Eyes clear, extraocular muscles intact.    Neck:  Small goiter, no LAN or nodules.  No thyroid bruit.  Cardiovascular:  RRR.  Pulmonary:  No coughing, respiratory effort normal.    Gastrointestinal:  Benign,  Neurologic:  Alert and oriented, no tremor.    Psychiatric: mood and affect normal.  Skin:  W/D.        Diagnostic Review:    As above.    No results found for: HA1C  Lab Results   Component Value Date    TSH 1.370 03/10/2019    FREET4 1.24 03/10/2019     TSH (no units)   Date Value   03/10/2019 1.370         [name], MD

## 2019-04-22 ENCOUNTER — Other Ambulatory Visit (INDEPENDENT_AMBULATORY_CARE_PROVIDER_SITE_OTHER): Payer: Self-pay

## 2019-05-24 ENCOUNTER — Encounter (INDEPENDENT_AMBULATORY_CARE_PROVIDER_SITE_OTHER): Payer: Self-pay

## 2019-06-12 ENCOUNTER — Other Ambulatory Visit (INDEPENDENT_AMBULATORY_CARE_PROVIDER_SITE_OTHER): Payer: Self-pay

## 2019-06-15 ENCOUNTER — Other Ambulatory Visit: Payer: Self-pay

## 2019-06-15 ENCOUNTER — Encounter (INDEPENDENT_AMBULATORY_CARE_PROVIDER_SITE_OTHER): Payer: Self-pay

## 2019-06-15 ENCOUNTER — Ambulatory Visit (INDEPENDENT_AMBULATORY_CARE_PROVIDER_SITE_OTHER): Payer: Managed Care, Other (non HMO)

## 2019-06-15 VITALS — BP 160/103 | HR 76 | Ht 70.0 in | Wt 165.0 lb

## 2019-06-15 DIAGNOSIS — E0500 Thyrotoxicosis with diffuse goiter without thyrotoxic crisis or storm: Secondary | ICD-10-CM

## 2019-06-15 MED ORDER — METHIMAZOLE 5 MG TABLET
ORAL_TABLET | ORAL | 0 refills | Status: DC
Start: 2019-06-15 — End: 2019-08-11

## 2019-06-15 NOTE — Progress Notes (Signed)
Endocrinology, Medical Staff Office [place]  [address] [place] [address]  [phone]    Date: 06/15/2019  Patient Name:  Deborah Parrish   Age: 49 y.o.  Attending: [name], MD  PCP: No Pcp    Visit today by video stream with patient consent.    Assessment/Recommendations:   1.  Graves' Thyrotoxicosis.  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  Started [place] 06/2018.  Plan:   Continue current methimazole dose.  Call if symptoms of thyroid dysfunction, which were reviewed.  She understands to hold medication and call if fevers, chills or sore throat, jaundice, abdominal pain.    Still prefers MMI to surgery or RAI.    Trend TFTS with TSI in mid April or sooner if new symptoms.      2. Low vitamin-D.  3. Positive (borderline) celiac serology.  Biopsy was negative but had H. pylori.  Plan:  F/U with PCP and  GI.    4. Low B12.  Intrinsic factor antibodies negative 06/2018.  Plan:  Following up with PCP.    5.  BP.  Plan:  Above gaol today.  No need for atenolol re: thyroid currently and she is having fatigue.  Asked her to check in with PCP re: antihypertensive alternatives.      No follow-ups on file.      Chief Complaint:    No chief complaint on file.      History of Present Illness:  Interim history 06/15/2019:  Biochemically euthyroid in late January.  She was feeling mildly fatigued the time and continues to feel mildly fatigued.  She is on atenolol, pulse runs low when she takes a full tablet.    Blood pressure is running high today.    Previously took Amlodipine.  Otherwise, she is clinically euthyroid.  No fevers, chills, sore throat, abdominal pain or jaundice.    Interim history 03/16/2019:  Feeling well.  Complains of weight gain--stopped smoking earlier this year.  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  No heart racing or tremor.    Biochemistry normal pre-visit.    Interim history 12/31/2018:  Clinically euthyroid.  We had trouble getting her laboratories  initially but were able to receive them from [facility] after calling.  Thyroid levels were normal.  TSH has improved to mid normal with decrement in methimazole over the summer.  No fevers, chills or sore throat.      Interim history 12/01/2018:  Since last visit, she was able to have labs done with some difficulty for reasons that are unclear.  She ended up having labs done at [facility] but results have not found their way to my office yet.  She is clinically euthyroid without any heart racing or tremors.  She wants to stick with methimazole for now rather than proceeding to surgery or radioactive iodine.  No fevers, chills, sore throat, jaundice or nausea.    Interim history 11/24/2018:    I have not received labs but patient reports TSH was 6.4 in early July.  She has been on MMI 10 mg daily since last visit.  Some fatigue and emotional at times (no SI).    Interim history 09/22/2018:  Seen by video steaming.  No cough, shortness of breath or fever.  She is having some sweatiness at night but is otherwise euthyroid.  No further palpitations.  TSH is now slightly suppressed with normal free T4.  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  She is not having any fevers, chills or sore throat.  Still having loose stools and has not heard from Gastroenterology.  Her transaminase levels were minimally elevated.  No Tylenol or NSAIDs but does drink alcohol occasionally.    Interim history 07/17/2018:  This is a follow-up visit by tele health, done with patient consent.  Total phone time about 20 minutes.    Since starting methimazole, patient has been feeling better with decreased tremor and improving muscle weakness in her thighs.  She is having no issues with fevers, chills or sore throat.  Tremors persist but are much milder.  No complaints of heart racing or palpitations.  She was found to have positive celiac serology and has been refer to  Gastroenterology; however, she has not heard anything about appointment.  She has no issues with her eyes.  She continues on vitamin-D and vitamin B12.    ------------------------------------------------------------------------------------------------------------------------  This 49 year old female was referred by Prescott Parma for evaluation of thyrotoxicosis.  Beginning in  October, at about the same time she quit smoking, patient began having issues with increasing heat intolerance, heart racing, tremors, moodiness and insomnia.  She was found to have evidence of thyrotoxicosis on labs as below.  There is a family history of what sounds like Graves' disease in a sister.      Patient denies taking any thyroid related medicines or supplements; no biotin; no contrast, kelp or other iodine exposure; no neck pain.  She is not having any issues with high grittiness or discomfort.  Her weight is up a few lb since she stop smoking.      She is having trouble with leg weakness with walking and has difficulty standing in the mornings.  Does drink up to 6 or 8 alcohol servings nightly.  Took atenolol 25 mg a day but pulse fell into the 50s and 60s, decreased to 12.5 mg a day and then stop.      She has been having some extremity edema and mild air hunger but has no history of cardiovascular disease or heart failure  Does not recall any issues with low pulse, shortness of breath or other issues on atenolol; she was feeling poorly when she was on atenolol but still feels fatigued.  Bowels are loose at times.    Lab review of results from 06/03/2018 shows undetectable TSH, free T4 of 2.75 (0.82-1.77), free T3 of 11.4 (2.0-4.4).  CMP was unremarkable with normal transaminases and alkaline phosphatase level.  CBC with a white count of 5.0.  ESR was only 3.  Additionally, B12 was 183 and vitamin-D 19.    Allergies:  No Known Allergies    Medications:    Outpatient Medications Marked as Taking for the 06/15/19 encounter (Office  Visit) with [name], MD   Medication Sig   . atenoloL (TENORMIN) 25 mg Oral Tablet Take 1 Tab (25 mg total) by mouth Once a day (Patient taking differently: Take 25 mg by mouth Once a day 1/2 tab daily)   . calcium carbonate-vitamin D3 1,000 mg(2,500 mg)-800 unit Oral Tablet Take by mouth   . methIMAzole (TAPAZOLE) 5 mg Oral Tablet TAKE 1 TABLET DAILY       Past Medical History:  Past Medical History:   Diagnosis Date   . Depression    . Hypertension    . Hyperthyroidism            Social  History:    Social History     Socioeconomic History   . Marital status: Single     Spouse name: Not on file   . Number of children: Not on file   . Years of education: Not on file   . Highest education level: Not on file   Tobacco Use   . Smoking status: Former Smoker     Packs/day: 1.00     Years: 20.00     Pack years: 20.00     Types: Cigarettes     Quit date: 01/14/2018     Years since quitting: 1.4   . Smokeless tobacco: Never Used   Substance and Sexual Activity   . Alcohol use: Yes   . Drug use: Not Currently   . Sexual activity: Yes     Partners: Male     Birth control/protection: Other     Social Determinants of Health     Financial Resource Strain:    . Difficulty of Paying Living Expenses:    Food Insecurity:    . Worried About Charity fundraiser in the Last Year:    . Arboriculturist in the Last Year:    Transportation Needs:    . Film/video editor (Medical):    Marland Kitchen Lack of Transportation (Non-Medical):    Physical Activity:    . Days of Exercise per Week:    . Minutes of Exercise per Session:    Stress:    . Feeling of Stress :    Intimate Partner Violence:    . Fear of Current or Ex-Partner:    . Emotionally Abused:    Marland Kitchen Physically Abused:    . Sexually Abused:        Family History:  Family Medical History:     Problem Relation (Age of Onset)    Alzheimer's/Dementia Maternal Grandmother    Arthritis-osteo Maternal Grandmother    Arthritis-rheumatoid Maternal Grandmother    Asthma Sister    Cancer Sister,  Maternal Grandmother    Heart Attack Mother    Stroke Father    Thyroid Disease Sister              Review of Systems:  Constitutional:  Fatigue, no other complaints.  HEENT: negative for acute vision changes or URI symptoms  Neck:  negative dysphonia, dysphagia or neck pain  Respiratory: negative for cough, sputum, dyspnea on exertion  Cardiovascular: negative for chest discomfort, palpitations  Gastrointestinal: negative for nausea, pain, stool changes  Genitourinary: negative for frequency, dysuria, nocturia, hesitancy  Neurological:  No complaints of tremors or other problems.  Endocrine:  As per HPI.  Full review of systems otherwise negative.    Objective:    Vital Signs:  BP (!) 160/103   Pulse 76   Ht 1.778 m (_0 )   Wt 74.8 kg (165 lb)   LMP  (LMP Unknown) Comment: 4 years no Mens cycle  BMI 23.68 kg/m     BP 143/98 on recheck of BP with pulse 57.     Body mass index is 23.68 kg/m.    Examination:  Gen:  Age appropriate, no acute distress.  HEENT:  Eyes clear, extraocular muscles intact.  Pulmonary:  No coughing, respiratory effort normal.  Neurologic:  Alert and oriented, no tremor.  Psychiatric: mood and affect normal.          Diagnostic Review:    As above.    No results found for: [facility]  Lab Results  Component Value Date    TSH 1.370 03/10/2019    FREET4 1.24 03/10/2019     TSH (no units)   Date Value   03/10/2019 1.370         [name], MD

## 2019-07-02 ENCOUNTER — Telehealth (INDEPENDENT_AMBULATORY_CARE_PROVIDER_SITE_OTHER): Payer: Self-pay

## 2019-07-02 MED ORDER — ATENOLOL 25 MG TABLET
12.50 mg | ORAL_TABLET | Freq: Two times a day (BID) | ORAL | 4 refills | Status: AC
Start: 2019-07-02 — End: ?

## 2019-07-02 NOTE — Telephone Encounter (Signed)
Spoke with patient.  She has not had her thyroid levels done yet.    Presently, she is on atenolol 12.5 mg in the morning and amlodipine 5 mg at night.      This is resulting in controlled blood pressure but tachycardia in the morning, pulse running in the 105 range.      She will drop the Amlodipine and start atenolol 12.5 mg twice a day.  She already has the medication on hand.      She will get her labs drawn at [facility] mid day.      Marchelle Folks, please set her up for a phone visit with me in 4:00 p.m. on Monday.  Please try to have the labs back from [facility] for the visit.

## 2019-07-02 NOTE — Telephone Encounter (Signed)
Patient spoke to her PCP regarding her blood pressure where it was elevated at her last appt with you. Her PCP wanted her to check with you before she made any adjustments to make sure she didn't need any adjustments or testing regarding her graves disease because she is having redness and swelling of her ankles.

## 2019-07-03 NOTE — Telephone Encounter (Signed)
Made appt

## 2019-07-06 ENCOUNTER — Encounter (INDEPENDENT_AMBULATORY_CARE_PROVIDER_SITE_OTHER): Payer: Self-pay

## 2019-07-06 ENCOUNTER — Ambulatory Visit: Payer: Managed Care, Other (non HMO)

## 2019-07-06 ENCOUNTER — Other Ambulatory Visit: Payer: Self-pay

## 2019-07-06 VITALS — Ht 70.0 in | Wt 165.0 lb

## 2019-07-06 DIAGNOSIS — E0500 Thyrotoxicosis with diffuse goiter without thyrotoxic crisis or storm: Secondary | ICD-10-CM

## 2019-07-06 DIAGNOSIS — R000 Tachycardia, unspecified: Secondary | ICD-10-CM

## 2019-07-06 NOTE — Progress Notes (Signed)
Endocrinology, Medical Staff Office Bldg  [address] [place] [address]  [phone]    Date: 07/06/2019  Patient Name:  Deborah Parrish   Age: 49 y.o.  Attending: [name], MD  PCP: No Pcp    Visit today by phone with patient consent.  Total time approximately 15 minutes.    Assessment/Recommendations:   1.  Graves' Thyrotoxicosis.  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  Started [place] 06/2018.  Plan:   Continue current methimazole 5 mg a day.  Call if symptoms of thyroid dysfunction, which were reviewed.  She understands to hold medication and call if fevers, chills or sore throat, jaundice, abdominal pain.    Still prefers MMI to surgery or RAI.    Trend TFTS before next visit in June 2021. Patient understands to call in the meantime if new symptoms.        2. Low vitamin-D.  3. Positive (borderline) celiac serology.  Biopsy was negative but had H. pylori.  Plan:  F/U with PCP and  GI.    4. Low B12.  Intrinsic factor antibodies negative 06/2018.  Plan:  Following up with PCP.    5.  BP.  6. Tachycardia.  Plan:  Tachycardia ongoing despite normal thyroid function test as of 07/06/2019 visit.  I have asked patient to check in with PCP and to continue atenolol 12.5 mg twice a day for now instead of recently added amlodipine.  Could consider cardiology evaluation.      No follow-ups on file.      Chief Complaint:    Chief Complaint   Patient presents with   . Hyperthyroidism       History of Present Illness:  Interim history 07/06/2019:  Attempts to come off atenolol resulted in tachycardia.  Thyroid levels were checked but these were normal.  I converted her regimen of atenolol 12.5 mg in the morning and Amlodipine in the evening to atenolol 12.5 mg b.i.d. As she was having tachycardia low 100s in the morning.  On the phone today, she is tolerating medications well, no fevers, chills, sore throat, jaundice or abdominal pain.  No heart racing or  tremor.    Interim history 06/15/2019:  Biochemically euthyroid in late January.  She was feeling mildly fatigued the time and continues to feel mildly fatigued.  She is on atenolol, pulse runs low when she takes a full tablet.    Blood pressure is running high today.    Previously took Amlodipine.  Otherwise, she is clinically euthyroid.  No fevers, chills, sore throat, abdominal pain or jaundice.    Interim history 03/16/2019:  Feeling well.  Complains of weight gain--stopped smoking earlier this year.  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  No heart racing or tremor.    Biochemistry normal pre-visit.    Interim history 12/31/2018:  Clinically euthyroid.  We had trouble getting her laboratories initially but were able to receive them from [facility] after calling.  Thyroid levels were normal.  TSH has improved to mid normal with decrement in methimazole over the summer.  No fevers, chills or sore throat.      Interim history 12/01/2018:  Since last visit, she was able to have labs done with some difficulty for reasons that are unclear.  She ended up having labs done at [facility] but results have not found their way to my office yet.  She is clinically euthyroid without any heart racing or  tremors.  She wants to stick with methimazole for now rather than proceeding to surgery or radioactive iodine.  No fevers, chills, sore throat, jaundice or nausea.    Interim history 11/24/2018:    I have not received labs but patient reports TSH was 6.4 in early July.  She has been on MMI 10 mg daily since last visit.  Some fatigue and emotional at times (no SI).    Interim history 09/22/2018:  Seen by video steaming.  No cough, shortness of breath or fever.  She is having some sweatiness at night but is otherwise euthyroid.  No further palpitations.  TSH is now slightly suppressed with normal free T4.  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  She is not having any  fevers, chills or sore throat.  Still having loose stools and has not heard from Gastroenterology.  Her transaminase levels were minimally elevated.  No Tylenol or NSAIDs but does drink alcohol occasionally.    Interim history 07/17/2018:  This is a follow-up visit by tele health, done with patient consent.  Total phone time about 20 minutes.    Since starting methimazole, patient has been feeling better with decreased tremor and improving muscle weakness in her thighs.  She is having no issues with fevers, chills or sore throat.  Tremors persist but are much milder.  No complaints of heart racing or palpitations.  She was found to have positive celiac serology and has been refer to Gastroenterology; however, she has not heard anything about appointment.  She has no issues with her eyes.  She continues on vitamin-D and vitamin B12.    ------------------------------------------------------------------------------------------------------------------------  This 49 year old female was referred by Deborah Parrish for evaluation of thyrotoxicosis.  Beginning in  October, at about the same time she quit smoking, patient began having issues with increasing heat intolerance, heart racing, tremors, moodiness and insomnia.  She was found to have evidence of thyrotoxicosis on labs as below.  There is a family history of what sounds like Graves' disease in a sister.      Patient denies taking any thyroid related medicines or supplements; no biotin; no contrast, kelp or other iodine exposure; no neck pain.  She is not having any issues with high grittiness or discomfort.  Her weight is up a few lb since she stop smoking.      She is having trouble with leg weakness with walking and has difficulty standing in the mornings.  Does drink up to 6 or 8 alcohol servings nightly.  Took atenolol 25 mg a day but pulse fell into the 50s and 60s, decreased to 12.5 mg a day and then stop.      She has been having some extremity edema and  mild air hunger but has no history of cardiovascular disease or heart failure  Does not recall any issues with low pulse, shortness of breath or other issues on atenolol; she was feeling poorly when she was on atenolol but still feels fatigued.  Bowels are loose at times.    Lab review of results from 06/03/2018 shows undetectable TSH, free T4 of 2.75 (0.82-1.77), free T3 of 11.4 (2.0-4.4).  CMP was unremarkable with normal transaminases and alkaline phosphatase level.  CBC with a white count of 5.0.  ESR was only 3.  Additionally, B12 was 183 and vitamin-D 19.    Allergies:  No Known Allergies    Medications:    Outpatient Medications  Marked as Taking for the 07/06/19 encounter (Office Visit) with [name], MD   Medication Sig   . atenoloL (TENORMIN) 25 mg Oral Tablet Take 0.5 Tablets (12.5 mg total) by mouth Twice daily   . calcium carbonate-vitamin D3 1,000 mg(2,500 mg)-800 unit Oral Tablet Take by mouth   . methIMAzole (TAPAZOLE) 5 mg Oral Tablet TAKE 1 TABLET DAILY.  Hold and call if fever, chills, sore throat, jaundice, abdominal pain.       Past Medical History:  Past Medical History:   Diagnosis Date   . Depression    . Hypertension    . Hyperthyroidism            Social History:    Social History     Socioeconomic History   . Marital status: Single     Spouse name: Not on file   . Number of children: Not on file   . Years of education: Not on file   . Highest education level: Not on file   Tobacco Use   . Smoking status: Former Smoker     Packs/day: 1.00     Years: 20.00     Pack years: 20.00     Types: Cigarettes     Quit date: 01/14/2018     Years since quitting: 1.4   . Smokeless tobacco: Never Used   Substance and Sexual Activity   . Alcohol use: Yes   . Drug use: Not Currently   . Sexual activity: Yes     Partners: Male     Birth control/protection: Other     Social Determinants of Health     Financial Resource Strain:    . Difficulty of Paying Living Expenses:    Food Insecurity:    . Worried About  Charity fundraiser in the Last Year:    . Arboriculturist in the Last Year:    Transportation Needs:    . Film/video editor (Medical):    Marland Kitchen Lack of Transportation (Non-Medical):    Physical Activity:    . Days of Exercise per Week:    . Minutes of Exercise per Session:    Stress:    . Feeling of Stress :    Intimate Partner Violence:    . Fear of Current or Ex-Partner:    . Emotionally Abused:    Marland Kitchen Physically Abused:    . Sexually Abused:        Family History:  Family Medical History:     Problem Relation (Age of Onset)    Alzheimer's/Dementia Maternal Grandmother    Arthritis-osteo Maternal Grandmother    Arthritis-rheumatoid Maternal Grandmother    Asthma Sister    Cancer Sister, Maternal Grandmother    Heart Attack Mother    Stroke Father    Thyroid Disease Sister              Review of Systems:  Constitutional:  Fatigue, no other complaints.  HEENT: negative for acute vision changes or URI symptoms  Neck:  negative dysphonia, dysphagia or neck pain  Respiratory: negative for cough, sputum, dyspnea on exertion  Cardiovascular: negative for chest discomfort, palpitations  Gastrointestinal: negative for nausea, pain, stool changes  Genitourinary: negative for frequency, dysuria, nocturia, hesitancy  Neurological:  No complaints of tremors or other problems.  Endocrine:  As per HPI.  Full review of systems otherwise negative.    Objective:    Vital Signs:  Ht 1.778 m ('5\' 10"'$ )   Wt 74.8 kg (  165 lb)   LMP  (LMP Unknown) Comment: 4 years no Mens cycle  BMI 23.68 kg/m     BP 143/98 on recheck of BP with pulse 57.     Body mass index is 23.68 kg/m.    Examination:  Gen:  Age appropriate, no acute distress.  HEENT:  Eyes clear, extraocular muscles intact.  Pulmonary:  No coughing, respiratory effort normal.  Neurologic:  Alert and oriented, no tremor.  Psychiatric: mood and affect normal.          Diagnostic Review:    As above.    No results found for: [facility]  Lab Results   Component Value Date    TSH 1.370  03/10/2019    FREET4 1.24 03/10/2019     TSH (no units)   Date Value   03/10/2019 1.370         [name], MD

## 2019-07-06 NOTE — Telephone Encounter (Signed)
Thyroid levels look good, will speak to her today.

## 2019-07-06 NOTE — Telephone Encounter (Signed)
Has phone appt today.

## 2019-07-07 ENCOUNTER — Telehealth (INDEPENDENT_AMBULATORY_CARE_PROVIDER_SITE_OTHER): Payer: Self-pay

## 2019-07-07 DIAGNOSIS — E0500 Thyrotoxicosis with diffuse goiter without thyrotoxic crisis or storm: Secondary | ICD-10-CM

## 2019-07-07 NOTE — Telephone Encounter (Signed)
Spoke with patient.    TSI now normal    Reduce MMI to 2.5mg  on MWF and continue 5mg  rest of the week.    She understands to call if new SX and will get repeat labs in 1 month.    Autumn, please mail lab orders to patient.

## 2019-07-15 ENCOUNTER — Encounter (INDEPENDENT_AMBULATORY_CARE_PROVIDER_SITE_OTHER): Payer: Self-pay

## 2019-08-11 ENCOUNTER — Telehealth (INDEPENDENT_AMBULATORY_CARE_PROVIDER_SITE_OTHER): Payer: Self-pay

## 2019-08-11 DIAGNOSIS — E0500 Thyrotoxicosis with diffuse goiter without thyrotoxic crisis or storm: Secondary | ICD-10-CM

## 2019-08-11 MED ORDER — METHIMAZOLE 5 MG TABLET
ORAL_TABLET | ORAL | 0 refills | Status: AC
Start: 2019-08-11 — End: ?

## 2019-08-11 NOTE — Telephone Encounter (Signed)
Thyroid is a little high, I will have to go back up on the methimazole.  Please ask her to resume methimazole 5 mg a day, I will send a new prescription.  Repeat TSH and free T4 just before next visit..  Call if any new problems in the meantime.

## 2019-09-15 ENCOUNTER — Encounter (INDEPENDENT_AMBULATORY_CARE_PROVIDER_SITE_OTHER): Payer: Self-pay

## 2019-09-22 ENCOUNTER — Encounter (INDEPENDENT_AMBULATORY_CARE_PROVIDER_SITE_OTHER): Payer: Self-pay

## 2019-09-22 ENCOUNTER — Ambulatory Visit (INDEPENDENT_AMBULATORY_CARE_PROVIDER_SITE_OTHER): Payer: Managed Care, Other (non HMO)

## 2019-09-22 ENCOUNTER — Other Ambulatory Visit: Payer: Self-pay

## 2019-09-22 VITALS — BP 126/86 | HR 57 | Ht 70.0 in | Wt 166.4 lb

## 2019-09-22 DIAGNOSIS — I10 Essential (primary) hypertension: Secondary | ICD-10-CM

## 2019-09-22 DIAGNOSIS — E0500 Thyrotoxicosis with diffuse goiter without thyrotoxic crisis or storm: Secondary | ICD-10-CM

## 2019-09-22 MED ORDER — LOSARTAN 50 MG TABLET
50.00 mg | ORAL_TABLET | Freq: Every day | ORAL | 0 refills | Status: DC
Start: 2019-09-22 — End: 2019-10-05

## 2019-09-22 NOTE — Patient Instructions (Signed)
Try losartan 50 mg daily in AM and reduce atenolol to 1/2 tab in PM only.  Labs in a week, call and discuss with vital signs.

## 2019-09-22 NOTE — Progress Notes (Signed)
Endocrinology, Medical Staff Office Bldg  [address] [place]  [address]  [phone]    Date: 09/22/2019  Patient Name:  Deborah Parrish   Age: 49 y.o.  Attending: [name], MD  PCP: No Pcp    Visit today by phone with patient consent.  Total time approximately 15 minutes.    Assessment/Recommendations:   1.  Graves' Thyrotoxicosis.  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  Started [place] 06/2018.  Plan:   Continue current methimazole 5 mg a day.  Call if symptoms of thyroid dysfunction, which were reviewed.  She understands to hold medication and call if fevers, chills or sore throat, jaundice, abdominal pain.    Still prefers MMI to surgery or RAI.    Trend TFTS before next visit in June 2021. Patient understands to call in the meantime if new symptoms.        2. Low vitamin-D.  3. Positive (borderline) celiac serology.  Biopsy was negative but had H. pylori.  Plan:  F/U with PCP and  GI.    4. Low B12.  Intrinsic factor antibodies negative 06/2018.  Plan:  Following up with PCP.    5.  BP.  6. Tachycardia.  Plan:  Trial of decreasing atenolol to 1/2 tablet in p.m. Only and addition of losartan 50 mg a day.  She understands the importance of follow-up labs and blood pressure week later.      No follow-ups on file.      Chief Complaint:    No chief complaint on file.      History of Present Illness:  09/23/2019:  Pulse increased after taper of atenolol last visit, she was taking atenolol in the morning and having heart racing following morning.  Now holes on the load side and has fatigue.   Euthyroid on methimazole 5 mg once daily.  No fevers, chills or sore throat.    Interim history 07/06/2019:  Attempts to come off atenolol resulted in tachycardia.  Thyroid levels were checked but these were normal.  I converted her regimen of atenolol 12.5 mg in the morning and Amlodipine in the evening to atenolol 12.5 mg b.i.d. As she was having tachycardia low 100s in  the morning.  On the phone today, she is tolerating medications well, no fevers, chills, sore throat, jaundice or abdominal pain.  No heart racing or tremor.    Interim history 06/15/2019:  Biochemically euthyroid in late January.  She was feeling mildly fatigued the time and continues to feel mildly fatigued.  She is on atenolol, pulse runs low when she takes a full tablet.    Blood pressure is running high today.    Previously took Amlodipine.  Otherwise, she is clinically euthyroid.  No fevers, chills, sore throat, abdominal pain or jaundice.    Interim history 03/16/2019:  Feeling well.  Complains of weight gain--stopped smoking earlier this year.  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  No heart racing or tremor.    Biochemistry normal pre-visit.    Interim history 12/31/2018:  Clinically euthyroid.  We had trouble getting her laboratories initially but were able to receive them from [facility] after calling.  Thyroid levels were normal.  TSH has improved to mid normal with decrement in methimazole over the summer.  No fevers, chills or sore throat.      Interim history 12/01/2018:  Since last visit, she was able to have labs done with some difficulty for reasons  that are unclear.  She ended up having labs done at [facility] but results have not found their way to my office yet.  She is clinically euthyroid without any heart racing or tremors.  She wants to stick with methimazole for now rather than proceeding to surgery or radioactive iodine.  No fevers, chills, sore throat, jaundice or nausea.    Interim history 11/24/2018:    I have not received labs but patient reports TSH was 6.4 in early July.  She has been on MMI 10 mg daily since last visit.  Some fatigue and emotional at times (no SI).    Interim history 09/22/2018:  Seen by video steaming.  No cough, shortness of breath or fever.  She is having some sweatiness at night but is otherwise euthyroid.  No further palpitations.  TSH is now slightly suppressed with  normal free T4.  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  She is not having any fevers, chills or sore throat.  Still having loose stools and has not heard from Gastroenterology.  Her transaminase levels were minimally elevated.  No Tylenol or NSAIDs but does drink alcohol occasionally.    Interim history 07/17/2018:  This is a follow-up visit by tele health, done with patient consent.  Total phone time about 20 minutes.    Since starting methimazole, patient has been feeling better with decreased tremor and improving muscle weakness in her thighs.  She is having no issues with fevers, chills or sore throat.  Tremors persist but are much milder.  No complaints of heart racing or palpitations.  She was found to have positive celiac serology and has been refer to Gastroenterology; however, she has not heard anything about appointment.  She has no issues with her eyes.  She continues on vitamin-D and vitamin B12.    ------------------------------------------------------------------------------------------------------------------------  This 49 year old female was referred by Prescott Parma for evaluation of thyrotoxicosis.  Beginning in  October, at about the same time she quit smoking, patient began having issues with increasing heat intolerance, heart racing, tremors, moodiness and insomnia.  She was found to have evidence of thyrotoxicosis on labs as below.  There is a family history of what sounds like Graves' disease in a sister.      Patient denies taking any thyroid related medicines or supplements; no biotin; no contrast, kelp or other iodine exposure; no neck pain.  She is not having any issues with high grittiness or discomfort.  Her weight is up a few lb since she stop smoking.      She is having trouble with leg weakness with walking and has difficulty standing in the mornings.  Does drink up to 6 or 8 alcohol servings nightly.  Took  atenolol 25 mg a day but pulse fell into the 50s and 60s, decreased to 12.5 mg a day and then stop.      She has been having some extremity edema and mild air hunger but has no history of cardiovascular disease or heart failure  Does not recall any issues with low pulse, shortness of breath or other issues on atenolol; she was feeling poorly when she was on atenolol but still feels fatigued.  Bowels are loose at times.    Lab review of results from 06/03/2018 shows undetectable TSH, free T4 of 2.75 (0.82-1.77), free T3 of 11.4 (2.0-4.4).  CMP was unremarkable with normal transaminases and alkaline phosphatase level.  CBC with a  white count of 5.0.  ESR was only 3.  Additionally, B12 was 183 and vitamin-D 19.    Allergies:  No Known Allergies    Medications:    Outpatient Medications Marked as Taking for the 09/22/19 encounter (Office Visit) with [name], MD   Medication Sig   . atenoloL (TENORMIN) 25 mg Oral Tablet Take 0.5 Tablets (12.5 mg total) by mouth Twice daily   . calcium carbonate-vitamin D3 1,000 mg(2,500 mg)-800 unit Oral Tablet Take by mouth   . methIMAzole (TAPAZOLE) 5 mg Oral Tablet TAKE 1 TABLET DAILY.  Hold and call if fever, chills, sore throat, jaundice, abdominal pain.       Past Medical History:  Past Medical History:   Diagnosis Date   . Depression    . Hypertension    . Hyperthyroidism            Social History:    Social History     Socioeconomic History   . Marital status: Single     Spouse name: Not on file   . Number of children: Not on file   . Years of education: Not on file   . Highest education level: Not on file   Tobacco Use   . Smoking status: Former Smoker     Packs/day: 1.00     Years: 20.00     Pack years: 20.00     Types: Cigarettes     Quit date: 01/14/2018     Years since quitting: 1.6   . Smokeless tobacco: Never Used   Substance and Sexual Activity   . Alcohol use: Yes   . Drug use: Not Currently   . Sexual activity: Yes     Partners: Male     Birth control/protection:  Other     Social Determinants of Health     Financial Resource Strain:    . Difficulty of Paying Living Expenses:    Food Insecurity:    . Worried About Charity fundraiser in the Last Year:    . Arboriculturist in the Last Year:    Transportation Needs:    . Film/video editor (Medical):    Marland Kitchen Lack of Transportation (Non-Medical):    Physical Activity:    . Days of Exercise per Week:    . Minutes of Exercise per Session:    Stress:    . Feeling of Stress :    Intimate Partner Violence:    . Fear of Current or Ex-Partner:    . Emotionally Abused:    Marland Kitchen Physically Abused:    . Sexually Abused:        Family History:  Family Medical History:     Problem Relation (Age of Onset)    Alzheimer's/Dementia Maternal Grandmother    Arthritis-osteo Maternal Grandmother    Arthritis-rheumatoid Maternal Grandmother    Asthma Sister    Cancer Sister, Maternal Grandmother    Heart Attack Mother    Stroke Father    Thyroid Disease Sister              Review of Systems:  Constitutional:  Fatigue, no other complaints.  HEENT: negative for acute vision changes or URI symptoms  Neck:  negative dysphonia, dysphagia or neck pain  Respiratory: negative for cough, sputum, dyspnea on exertion  Cardiovascular: negative for chest discomfort, palpitations  Gastrointestinal: negative for nausea, pain, stool changes  Genitourinary: negative for frequency, dysuria, nocturia, hesitancy  Neurological:  No complaints of tremors or other  problems.  Endocrine:  As per HPI.  Full review of systems otherwise negative.    Objective:    Vital Signs:  BP 126/86   Pulse 57   Ht 1.778 m ('5\' 10"' )   Wt 75.5 kg (166 lb 6.4 oz)   LMP  (LMP Unknown) Comment: 4 years no Mens cycle  SpO2 97%   BMI 23.88 kg/m      Body mass index is 23.88 kg/m.    Examination:  Gen:  Age appropriate, no acute distress.    HEENT:  Eyes clear, extraocular muscles intact.    Neck:  Cor:  Pulmonary:  No coughing, respiratory effort normal.    Abdomen:  Neurologic:  Alert and  oriented, no tremor.    Psychiatric: mood and affect normal.          Diagnostic Review:    As above.    No results found for: HA1C  Lab Results   Component Value Date    TSH 1.370 03/10/2019    FREET4 1.24 03/10/2019     TSH (no units)   Date Value   03/10/2019 1.370         [name], MD

## 2019-10-01 ENCOUNTER — Telehealth (INDEPENDENT_AMBULATORY_CARE_PROVIDER_SITE_OTHER): Payer: Self-pay

## 2019-10-01 DIAGNOSIS — E0500 Thyrotoxicosis with diffuse goiter without thyrotoxic crisis or storm: Secondary | ICD-10-CM

## 2019-10-01 NOTE — Telephone Encounter (Signed)
Pt called in today stating you wanted her to keep a check on her BP sense you started her on BP medication. She  has been checking her BP/HR's sense 6/9 she usally takes BP on  6:30 am they have been around top numbers high 120's bottom numbers in low 70's - 80's she   doesn't check through out the day at work.  She take her BP medication around 8:00 a.m.  Today she checked her BP 9:00 am/m it was 134/84 HR 62.    In evenings 120- 130's, bottom number high 70-80's HR usually in the 80's. She states she had gotten her blood work at [facility]. She would like to know if you gotten results on that. Please advise.

## 2019-10-01 NOTE — Telephone Encounter (Signed)
BP sounds good.    Labs look very good, glucose was barley high if this was fasting but normal if non-fasting.  Call if increased thirst or urination.    Repeat labs I will order now in 6 weeks and call if new symptoms i meantime.    Please mail labs and arrange video visit for a few days after labs.

## 2019-10-05 ENCOUNTER — Other Ambulatory Visit (INDEPENDENT_AMBULATORY_CARE_PROVIDER_SITE_OTHER): Payer: Self-pay

## 2019-10-05 MED ORDER — LOSARTAN 50 MG TABLET
50.00 mg | ORAL_TABLET | Freq: Every day | ORAL | 3 refills | Status: AC
Start: 2019-10-05 — End: ?

## 2019-10-20 ENCOUNTER — Telehealth (INDEPENDENT_AMBULATORY_CARE_PROVIDER_SITE_OTHER): Payer: Self-pay

## 2019-10-20 DIAGNOSIS — E0500 Thyrotoxicosis with diffuse goiter without thyrotoxic crisis or storm: Secondary | ICD-10-CM

## 2019-10-20 NOTE — Telephone Encounter (Signed)
Patient had labs drawn at [facility], CBC regarding methimazole and sore throat.

## 2019-10-20 NOTE — Telephone Encounter (Signed)
Pt calling because you always tell her to call if she has  Sore throat, fever, chills and she stated she isn't quite sure what you mean, but, for the past few days she has had a sore throat, far back and low and feels like she's getting choked up. Should she be worried? Please advise, [phone]

## 2019-10-20 NOTE — Telephone Encounter (Signed)
Spoke with patient.    Throat sore but no fever or chills.    Hold medicine and report for labs, call tomorrow afternoon.        Marchelle Folks, please fax orders to:  [facility] [address], [place].

## 2019-10-21 NOTE — Telephone Encounter (Signed)
Last discussed with patient, neutrophil count normal.  Continues to have a sore throat that is troublesome at night.  Her voice sounds like she has nasal congestion on the phone.  I suggested she try over-the-counter allergy or URI medications and follow-up with PCP.  She understands to reach contact me worsening sore throat or fever/chills.

## 2019-10-22 ENCOUNTER — Encounter (INDEPENDENT_AMBULATORY_CARE_PROVIDER_SITE_OTHER): Payer: Self-pay

## 2019-11-14 ENCOUNTER — Other Ambulatory Visit (INDEPENDENT_AMBULATORY_CARE_PROVIDER_SITE_OTHER): Payer: Self-pay

## 2019-11-26 ENCOUNTER — Encounter (INDEPENDENT_AMBULATORY_CARE_PROVIDER_SITE_OTHER): Payer: Self-pay

## 2019-11-27 ENCOUNTER — Other Ambulatory Visit (INDEPENDENT_AMBULATORY_CARE_PROVIDER_SITE_OTHER): Payer: Self-pay

## 2019-12-02 ENCOUNTER — Encounter (INDEPENDENT_AMBULATORY_CARE_PROVIDER_SITE_OTHER): Payer: Managed Care, Other (non HMO)

## 2020-02-23 ENCOUNTER — Encounter (INDEPENDENT_AMBULATORY_CARE_PROVIDER_SITE_OTHER): Payer: Self-pay

## 2020-04-17 ENCOUNTER — Other Ambulatory Visit (INDEPENDENT_AMBULATORY_CARE_PROVIDER_SITE_OTHER): Payer: Self-pay

## 2020-06-12 ENCOUNTER — Other Ambulatory Visit (INDEPENDENT_AMBULATORY_CARE_PROVIDER_SITE_OTHER): Payer: Self-pay

## 2020-06-28 IMAGING — MR MRI CERVICAL SPINE WITHOUT CONTRAST
4 of 5 series · 24 of 48 positions shown · non-contrast
Comparison: None.

﻿EXAM:  [id number]   MRI CERVICAL SPINE WITHOUT CONTRAST
INDICATION: Worsening neck and left upper extremity pain.
TECHNIQUE: Noncontrast multiplanar, multisequence MRI was performed.

[Series 5: T2 · sagittal · 3.0mm · 0.75mm/px · 8 of 15 slices shown (1 of 2)]
[im 1/15]
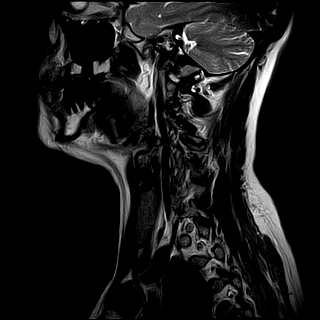
[im 3/15]
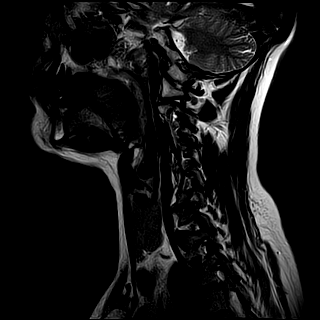
[im 5/15]
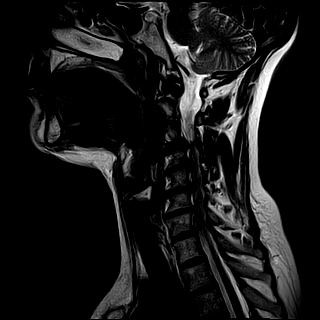
[im 7/15]
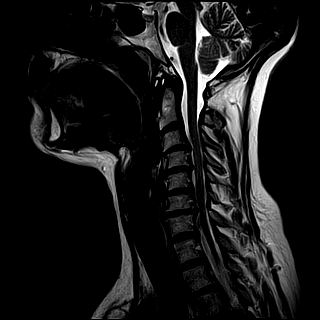
[im 9/15]
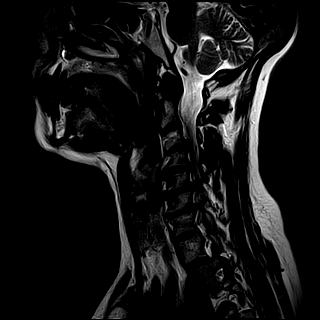
[im 11/15]
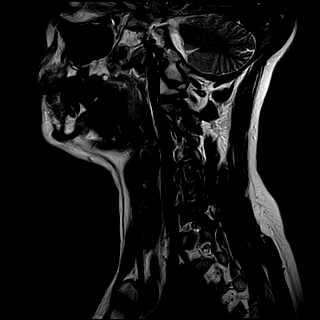
[im 13/15]
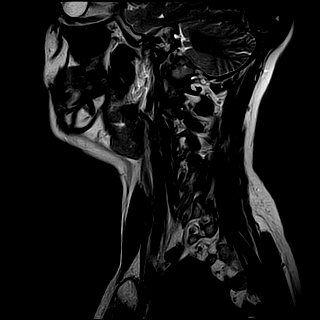
[im 15/15]
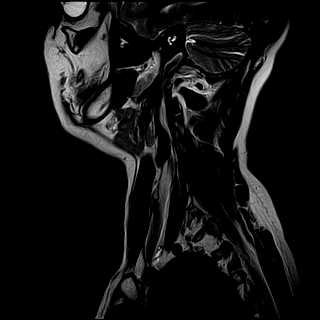

[Series 6: T1 · sagittal · 3.0mm · 0.47mm/px · 3 of 15 slices shown]
[im 2/15]
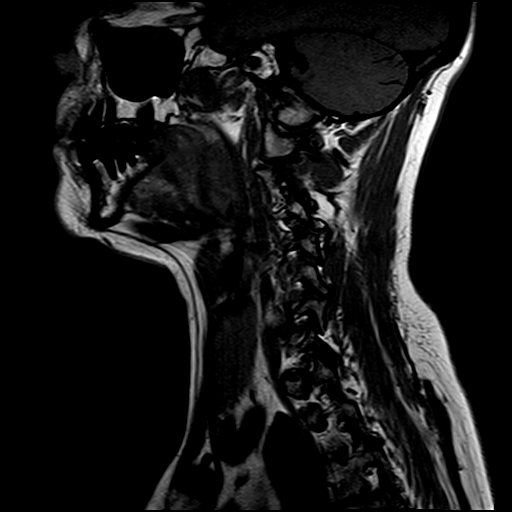
[im 8/15]
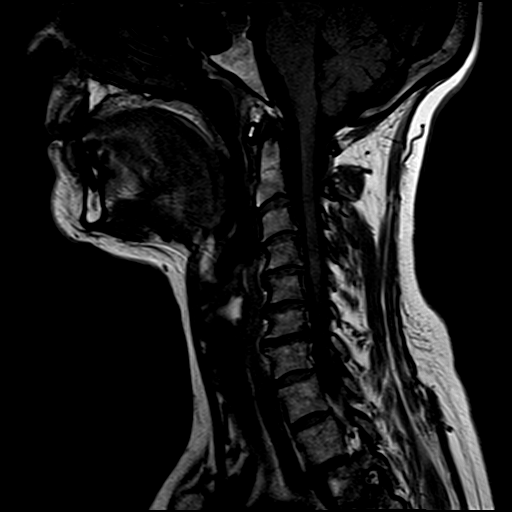
[im 13/15]
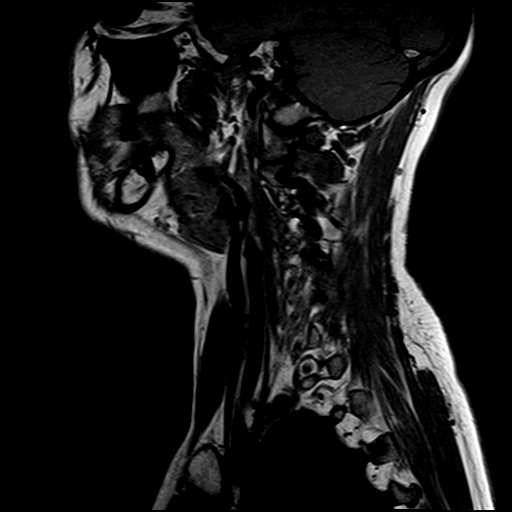

[Series 7: STIR · sagittal · 3.0mm · 0.47mm/px · 3 of 15 slices shown]
[im 2/15]
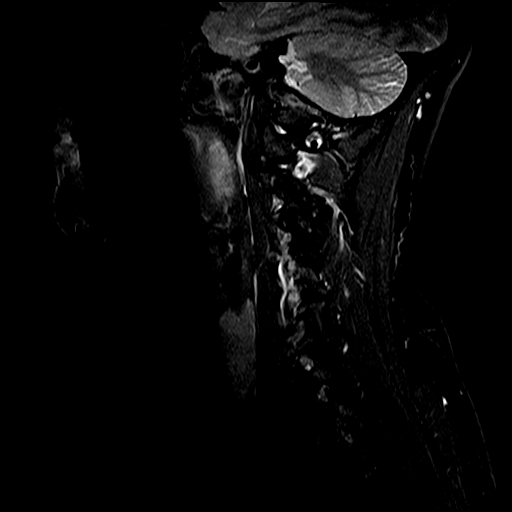
[im 8/15]
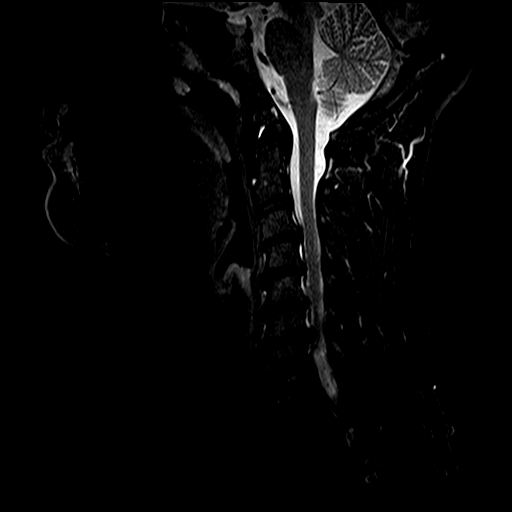
[im 13/15]
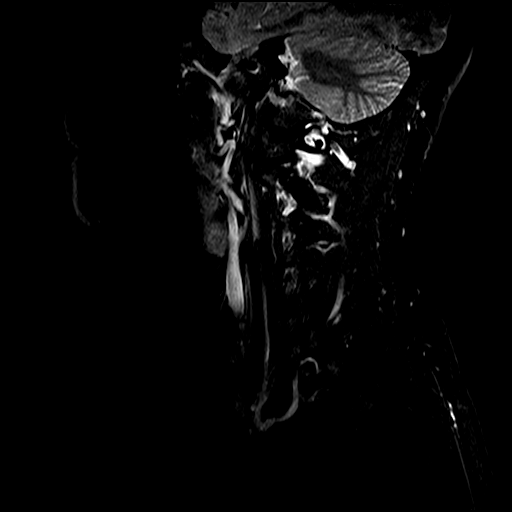

[Series 9: T2 · axial · 3.0mm · 0.39mm/px · z∈[-90,+4]mm · 10 of 18 slices shown (2 of 2)]
[im 1/18]
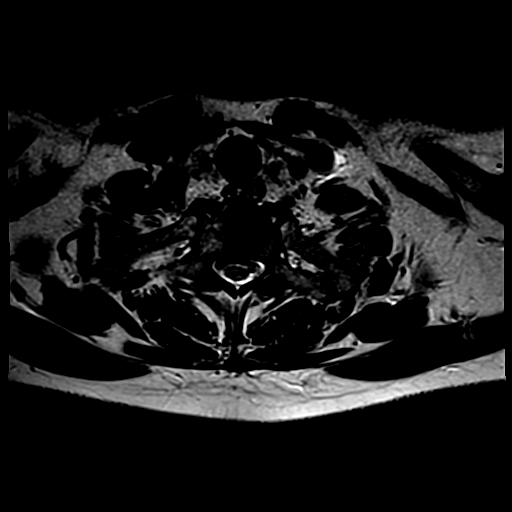
[im 2/18]
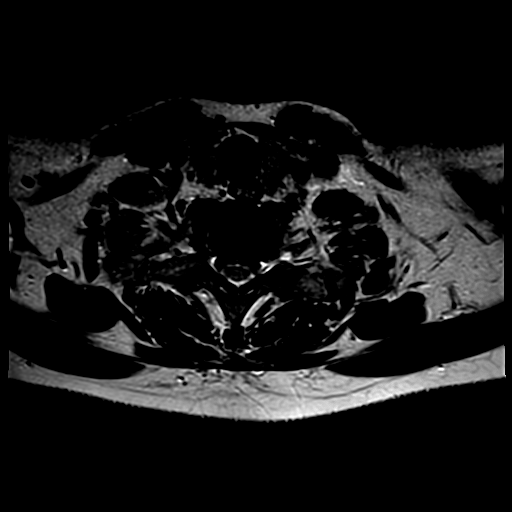
[im 4/18]
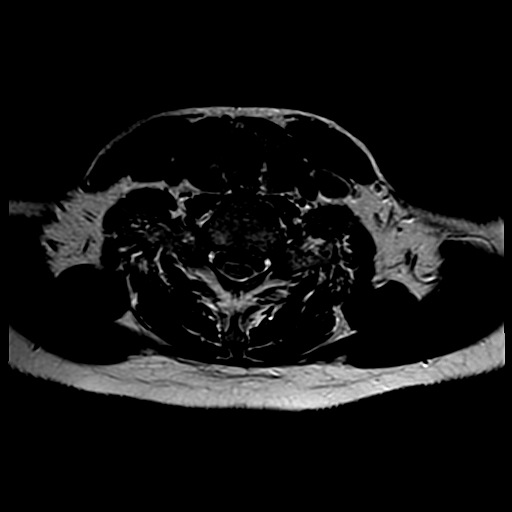
[im 6/18]
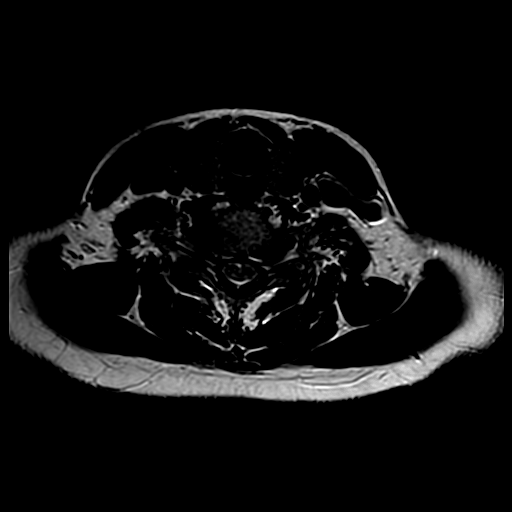
[im 7/18]
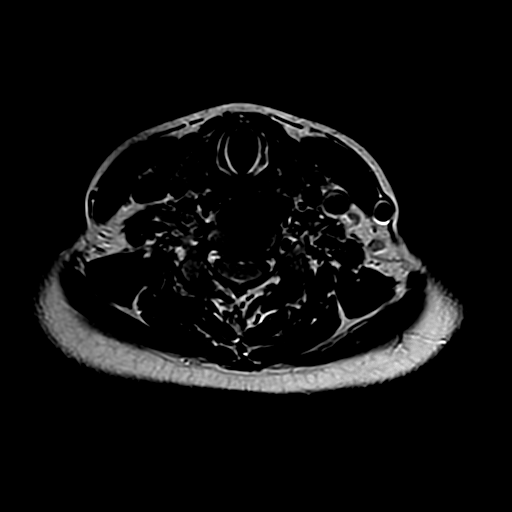
[im 9/18]
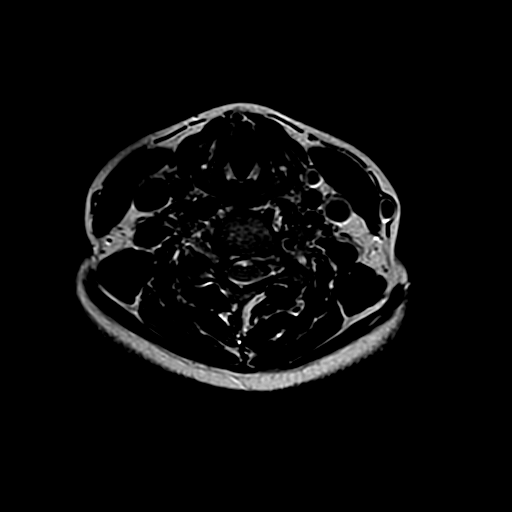
[im 11/18]
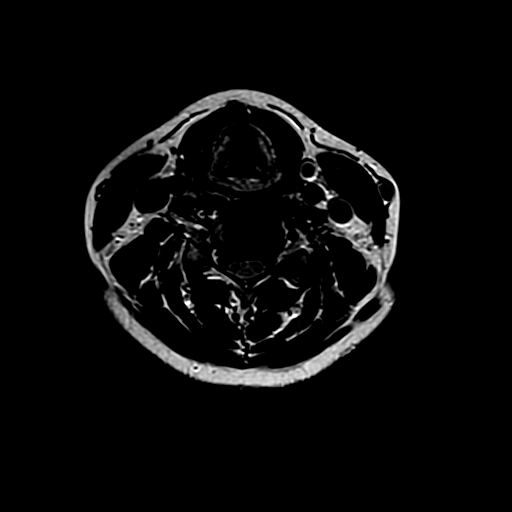
[im 12/18]
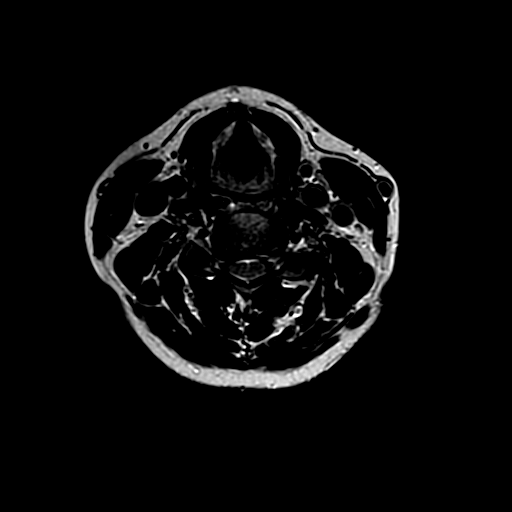
[im 14/18]
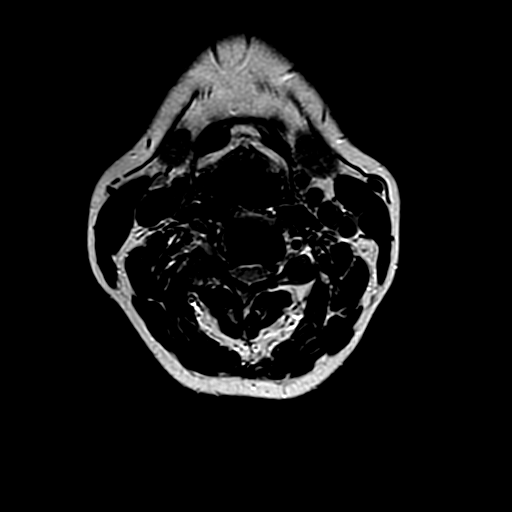
[im 16/18]
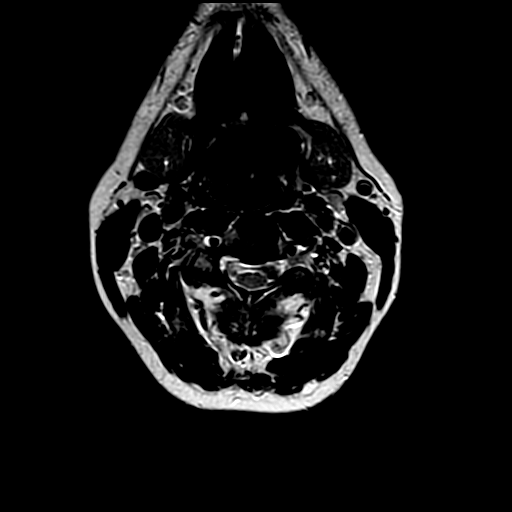

[24 of 48 positions shown; findings below may reference images not displayed]

FINDINGS: Noncontrast multiplanar, multisequence MRI demonstrates moderate degenerative and hypertrophic changes at multiple levels with disc space narrowing and osteophyte formation.  There are disc-osteophyte complexes at multiple levels including C3-C4, C4-C5, C5-C6, C6-C7, and C7-T1.  There is mild compression of the thecal sac at these levels producing multilevel mild spinal stenosis.  No spinal cord compression or myelomalacia is seen.  

There is no fracture or malalignment.  There is reversal of the normal cervical lordosis in the upper cervical spine.  There is no Chiari malformation.
IMPRESSION: 1. Moderate degenerative changes.  

2. Mild multilevel spinal stenosis. 

:

## 2020-09-25 ENCOUNTER — Other Ambulatory Visit (INDEPENDENT_AMBULATORY_CARE_PROVIDER_SITE_OTHER): Payer: Self-pay

## 2022-12-05 IMAGING — MR MRI SHOULDER LT W/O CONTRAST
6 series · 39 of 40 positions shown · non-contrast
Comparison: None available.

﻿EXAM:  [id number]   MRI SHOULDER LT W/O CONTRAST
INDICATION: Left shoulder pain.
TECHNIQUE: Noncontrast multiplanar, multisequence MRI was performed.

[Series 6: T1 · oblique · left · 3.5mm · 0.33mm/px · 7 of 18 slices shown]
[im 1/18]
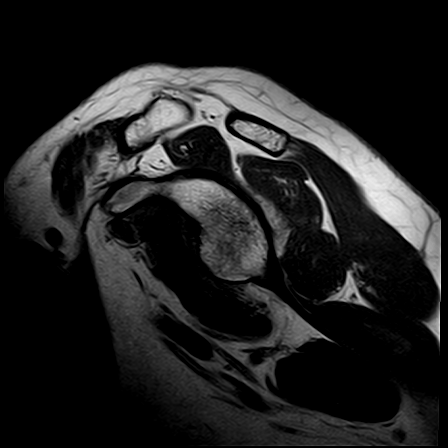
[im 3/18]
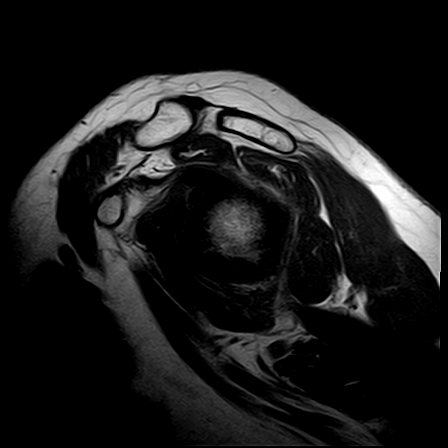
[im 6/18]
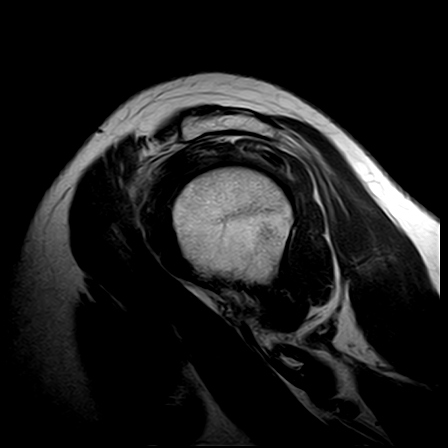
[im 9/18]
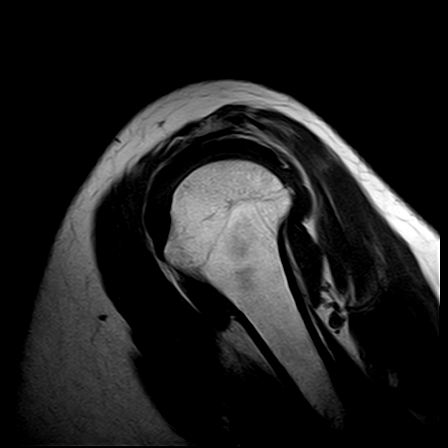
[im 12/18]
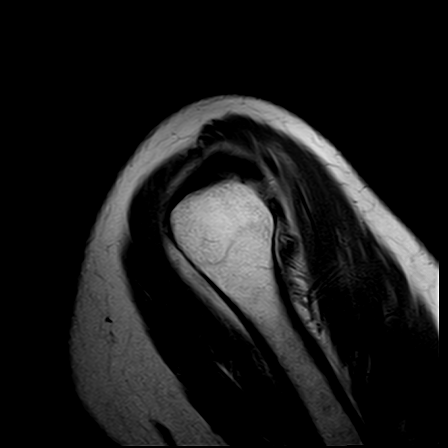
[im 15/18]
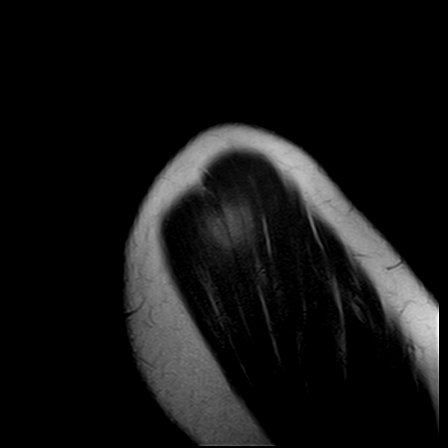
[im 18/18]
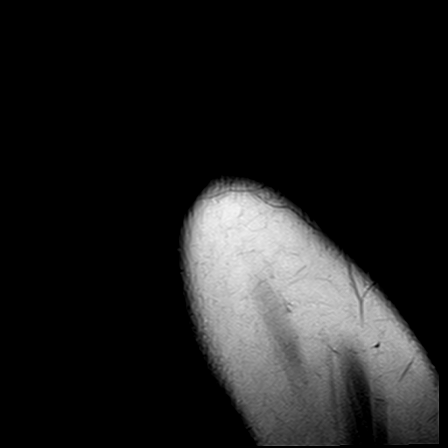

[Series 7: STIR · oblique · left · 3.5mm · 0.47mm/px · 7 of 18 slices shown (1 of 2)]
[im 1/18]
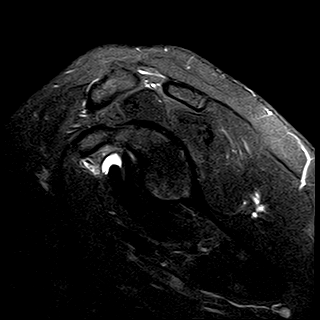
[im 3/18]
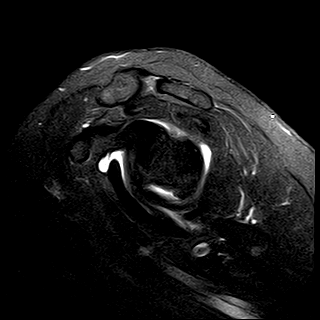
[im 6/18]
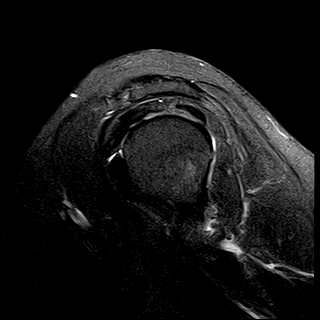
[im 9/18]
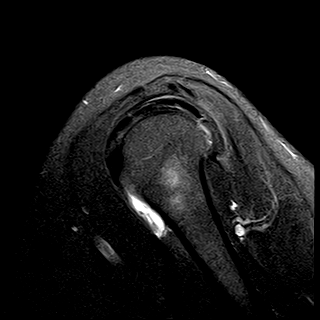
[im 12/18]
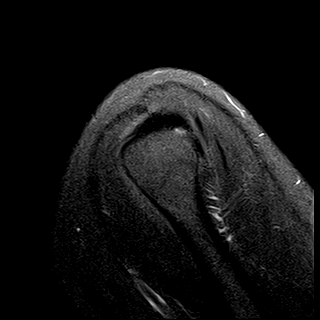
[im 15/18]
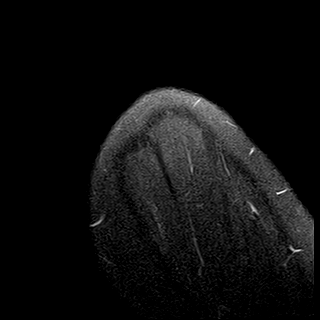
[im 18/18]
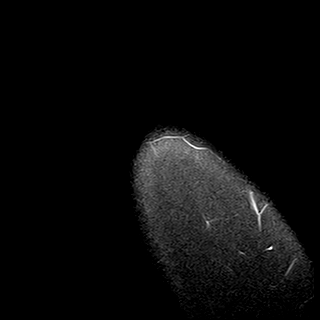

[Series 8: PD fat-sat · axial · left · 4.0mm · 0.50mm/px · z∈[-46,+30]mm · 6 of 18 slices shown (1 of 2)]
[im 1/18]
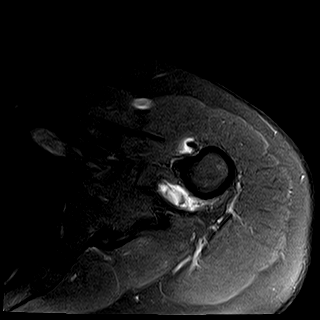
[im 4/18]
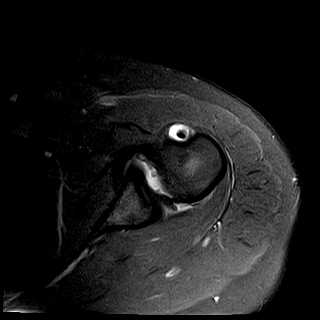
[im 7/18]
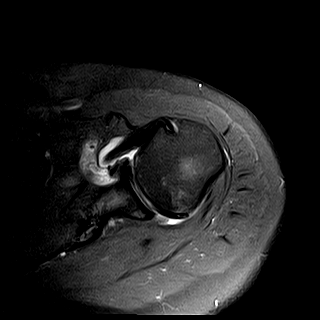
[im 11/18]
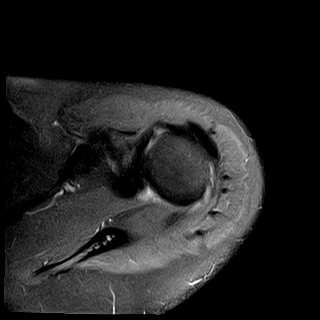
[im 14/18]
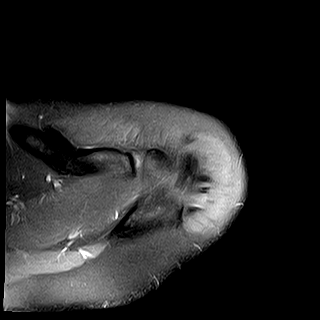
[im 18/18]
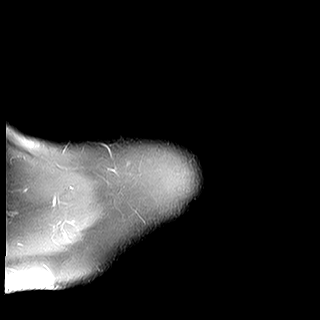

[Series 9: T2 fat-sat · axial · left · 4.0mm · 0.42mm/px · z∈[-59,+43]mm · 8 of 24 slices shown]
[im 1/24]
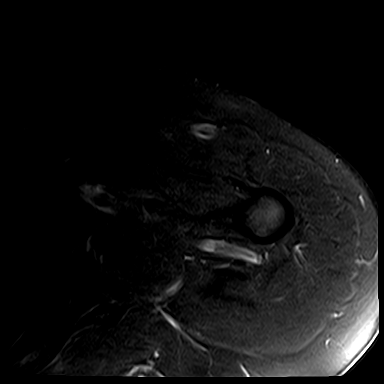
[im 4/24]
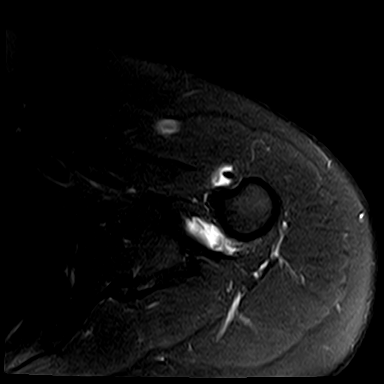
[im 7/24]
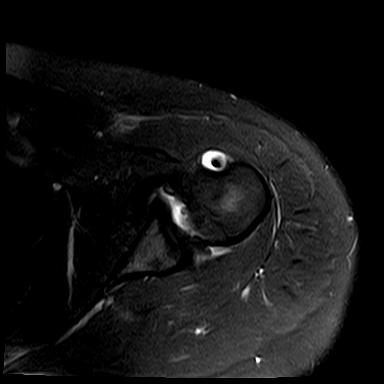
[im 10/24]
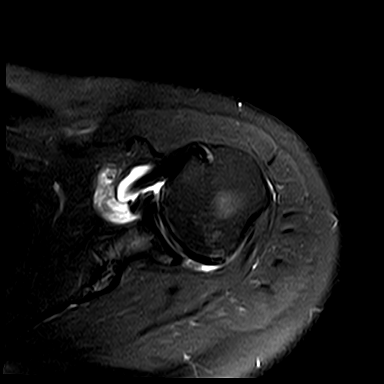
[im 14/24]
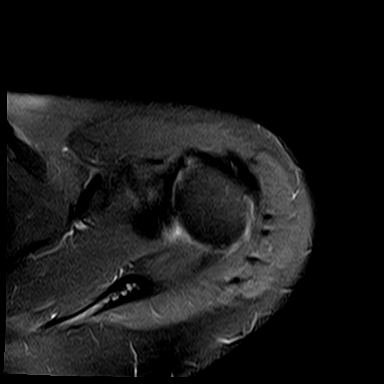
[im 17/24]
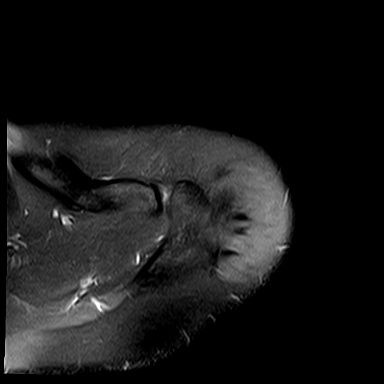
[im 20/24]
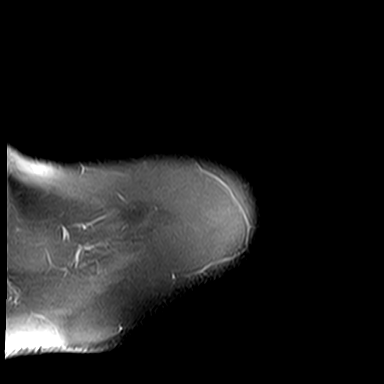
[im 24/24]
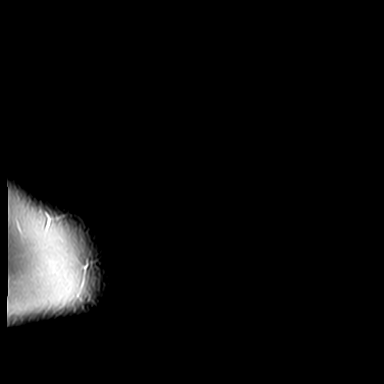

[Series 10: STIR · oblique · left · 3.5mm · 0.47mm/px · 5 of 18 slices shown (2 of 2)]
[im 1/18]
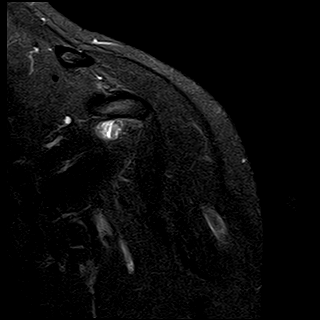
[im 4/18]
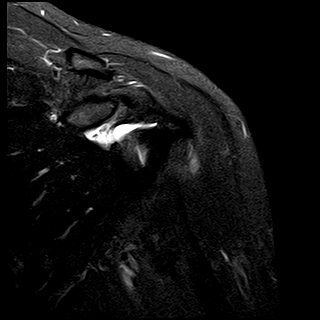
[im 7/18]
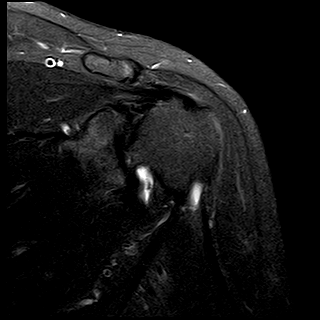
[im 11/18]
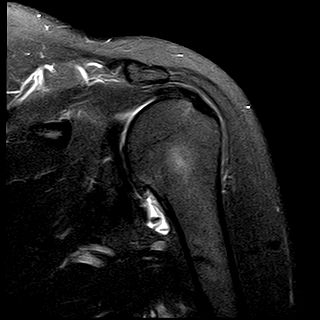
[im 14/18]
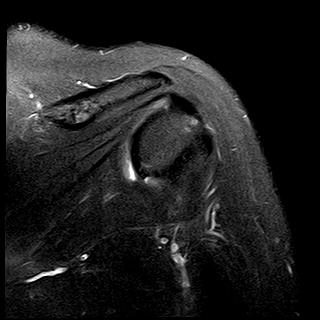

[Series 11: PD fat-sat · oblique · left · 3.5mm · 0.47mm/px · 6 of 18 slices shown (2 of 2)]
[im 1/18]
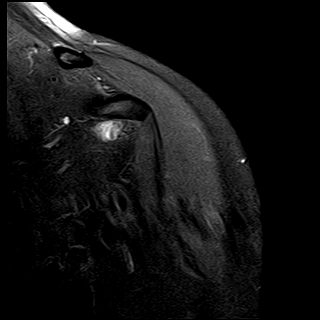
[im 4/18]
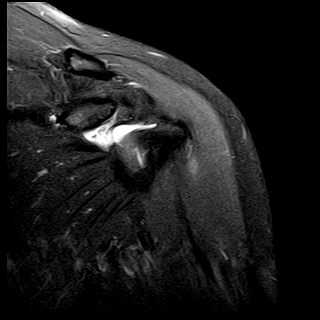
[im 7/18]
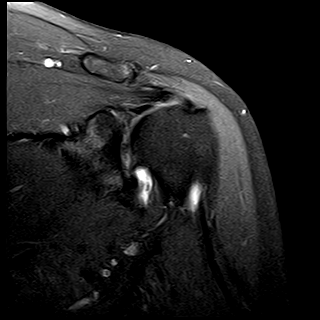
[im 11/18]
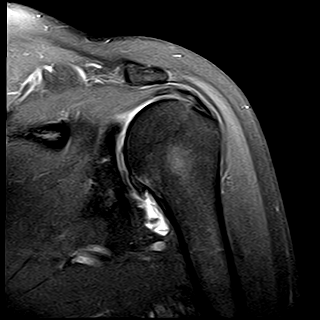
[im 14/18]
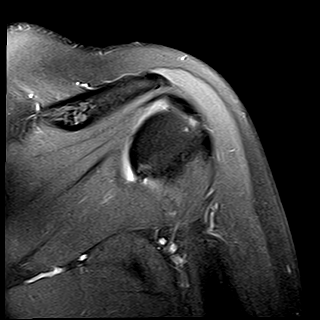
[im 18/18]
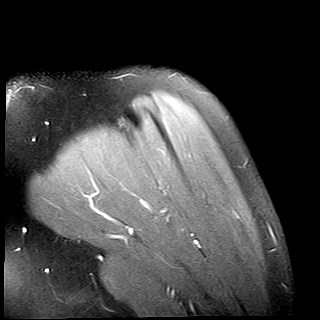

[39 of 40 positions shown; findings below may reference images not displayed]

FINDINGS: There is a small glenohumeral joint effusion.  There is trace fluid in the subacromial-subdeltoid bursa.  

There is a longitudinal, full thickness tear involving the supraspinatus tendon.  There is no tendon retraction.  The remaining visualized tendons appear to be intact.  The glenoid labrum is also intact.  

There are mild degenerative changes involving the acromioclavicular joint.  There is slight down sloping of the acromion with mild impingement.  

There are mild degenerative changes involving the glenohumeral joint.  There is no fracture, dislocation, or significant marrow signal alteration.
IMPRESSION: Rotator cuff tear.
